# Patient Record
Sex: Female | Born: 1940 | Race: White | Hispanic: No | Smoking: Former smoker
Health system: Southern US, Community
[De-identification: ages and names within clinical notes are randomized; demographics above are authoritative.]

## PROBLEM LIST (undated history)

## (undated) DIAGNOSIS — T8859XA Other complications of anesthesia, initial encounter: Secondary | ICD-10-CM

## (undated) DIAGNOSIS — R1013 Epigastric pain: Secondary | ICD-10-CM

## (undated) DIAGNOSIS — F32A Depression, unspecified: Secondary | ICD-10-CM

## (undated) DIAGNOSIS — I10 Essential (primary) hypertension: Secondary | ICD-10-CM

## (undated) DIAGNOSIS — F419 Anxiety disorder, unspecified: Secondary | ICD-10-CM

## (undated) HISTORY — DX: Epigastric pain: R10.13

## (undated) HISTORY — PX: EYE SURGERY: SHX253

## (undated) HISTORY — PX: OTHER SURGICAL HISTORY: SHX169

## (undated) HISTORY — PX: ABDOMINAL HYSTERECTOMY: SHX81

## (undated) HISTORY — PX: APPENDECTOMY: SHX54

## (undated) HISTORY — PX: INCONTINENCE SURGERY: SHX676

## (undated) HISTORY — PX: BUNIONECTOMY: SHX129

## (undated) HISTORY — PX: TOTAL SHOULDER ARTHROPLASTY: SHX126

## (undated) HISTORY — PX: BACK SURGERY: SHX140

---

## 2013-05-01 DIAGNOSIS — M19019 Primary osteoarthritis, unspecified shoulder: Secondary | ICD-10-CM | POA: Insufficient documentation

## 2014-04-23 DIAGNOSIS — M75120 Complete rotator cuff tear or rupture of unspecified shoulder, not specified as traumatic: Secondary | ICD-10-CM | POA: Insufficient documentation

## 2015-12-09 DIAGNOSIS — Z96612 Presence of left artificial shoulder joint: Secondary | ICD-10-CM | POA: Insufficient documentation

## 2017-10-04 DIAGNOSIS — Z85828 Personal history of other malignant neoplasm of skin: Secondary | ICD-10-CM | POA: Insufficient documentation

## 2018-12-21 DIAGNOSIS — M170 Bilateral primary osteoarthritis of knee: Secondary | ICD-10-CM | POA: Insufficient documentation

## 2019-07-13 ENCOUNTER — Emergency Department
Admission: EM | Admit: 2019-07-13 | Discharge: 2019-07-13 | Disposition: A | Discharge status: Home / Self Care | Payer: Medicare Other | Attending: Emergency Medicine | Admitting: Emergency Medicine

## 2019-07-13 ENCOUNTER — Other Ambulatory Visit: Payer: Self-pay

## 2019-07-13 DIAGNOSIS — I10 Essential (primary) hypertension: Secondary | ICD-10-CM | POA: Diagnosis not present

## 2019-07-13 DIAGNOSIS — K625 Hemorrhage of anus and rectum: Secondary | ICD-10-CM

## 2019-07-13 HISTORY — DX: Essential (primary) hypertension: I10

## 2019-07-13 LAB — CBC
HCT: 46.4 % — ABNORMAL HIGH (ref 36.0–46.0)
Hemoglobin: 14.9 g/dL (ref 12.0–15.0)
MCH: 30.1 pg (ref 26.0–34.0)
MCHC: 32.1 g/dL (ref 30.0–36.0)
MCV: 93.7 fL (ref 80.0–100.0)
Platelets: 205 10*3/uL (ref 150–400)
RBC: 4.95 MIL/uL (ref 3.87–5.11)
RDW: 14 % (ref 11.5–15.5)
WBC: 7.6 10*3/uL (ref 4.0–10.5)
nRBC: 0 % (ref 0.0–0.2)

## 2019-07-13 LAB — BASIC METABOLIC PANEL
Anion gap: 8 (ref 5–15)
BUN: 14 mg/dL (ref 8–23)
CO2: 26 mmol/L (ref 22–32)
Calcium: 9.2 mg/dL (ref 8.9–10.3)
Chloride: 108 mmol/L (ref 98–111)
Creatinine, Ser: 0.58 mg/dL (ref 0.44–1.00)
GFR calc Af Amer: >60 mL/min (ref >60)
GFR calc non Af Amer: >60 mL/min (ref >60)
Glucose, Bld: 104 mg/dL — ABNORMAL HIGH (ref 70–99)
Potassium: 4.1 mmol/L (ref 3.5–5.1)
Sodium: 142 mmol/L (ref 135–145)

## 2019-07-13 NOTE — Discharge Instructions (Signed)
Your workup in the Emergency Department today was reassuring.  We did not find any specific abnormalities.  We recommend you drink plenty of fluids, take your regular medications and/or any new ones prescribed today, and follow up with the doctor(s) listed in these documents as recommended.  Return to the Emergency Department if you develop new or worsening symptoms that concern you.  

## 2019-07-13 NOTE — ED Provider Notes (Signed)
[facility] Emergency Department Provider Note  ____________________________________________   First MD Initiated Contact with Patient 07/13/19 2302     (approximate)  I have reviewed the triage vital signs and the nursing notes.   HISTORY  Chief Complaint Rectal Bleeding    HPI Jessica Leonard is a 78 y.o. female with medical history as listed below which is notable for no prior history of blood clots and no chronic blood thinners.  She presents by private vehicle with her adult daughter at bedside for evaluation of relatively acute onset bright red blood per rectum.  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  She takes a daily fiber as well as an Imodium but sometimes she gets a little bit backed up and due to the Imodium.  This happened yesterday and she could tell that she was becoming constipated so she did not take the Imodium.  When she had a bowel movement it required a significant amount of effort and after she passed the stool she is to "golf ball size" blood clots that "looked like liver".  She has had a little bit of aching pain that is mild in her lower abdomen that has been relatively constant for 1 to 2 days but nothing in particular makes it better or worse.  She had a subsequent bowel movement which had streaks of blood in it, and then her most recent bowel movement had "a few dots of blood".  Because this was a new symptom for her she was concerned and came in for evaluation.  She has a GI doctor in [place] with whom she is well established for years and she reports that she had both an upper and lower endoscopy within the last calendar year which was reassuring except for a few polyps on the colonoscopy.  She denies recent fever/chills, sore throat, cough, shortness of breath, vomiting, and dysuria.  She has had a little bit of nausea but that is not uncommon for her.          Past Medical History:  Diagnosis Date  . Hypertension     There are no active problems to display for this patient.   Past Surgical History:  Procedure Laterality Date  . ABDOMINAL HYSTERECTOMY    . APPENDECTOMY    . back fusion    . cataracts    . INCONTINENCE SURGERY      Prior to Admission medications   Not on File    Allergies Patient has no allergy information on record.  No family history on file.  Social History Social History   Tobacco Use  . Smoking status: Not on file  Substance Use Topics  . Alcohol use: Not on file  . Drug use: Not on file    Review of Systems Constitutional: No fever/chills Eyes: No visual changes. ENT: No sore throat. Cardiovascular: Denies chest pain. Respiratory: Denies shortness of breath. Gastrointestinal: Some mild lower middle aching abdominal pain, some nausea, no vomiting.  Chronic bowel incontinence, recent constipation secondary to Imodium, now stooling habits are regular for her. Genitourinary: Negative for dysuria. Musculoskeletal: Negative for neck pain.  Negative for acute back pain. Integumentary: Negative for rash. Neurological: Negative for headaches, focal weakness or numbness.   ____________________________________________   PHYSICAL EXAM:  VITAL SIGNS: ED Triage Vitals [07/13/19 1829]  Enc Vitals Group     BP (!) 149/68     Pulse Rate 71  Resp 18     Temp 98.2 F (36.8 C)     Temp src      SpO2 100 %     Weight 94.8 kg (209 lb)     Height 1.6 m (5\' 3" )     Head Circumference      Peak Flow      Pain Score 4     Pain Loc      Pain Edu?      Excl. in [place]?     Constitutional: Alert and oriented.  Well-appearing and in no distress. Eyes: Conjunctivae are normal.  Head: Atraumatic. Nose: No congestion/rhinnorhea. Mouth/Throat: Patient is wearing a mask. Neck: No stridor.  No meningeal signs.   Cardiovascular: Normal rate, regular rhythm. Good peripheral circulation. Grossly normal heart  sounds. Respiratory: Normal respiratory effort.  No retractions. Gastrointestinal: Soft and nontender except for a very minor tenderness to palpation in the lower abdomen with no rebound and guarding.  Nondistended.  Rectal exam is notable for no external hemorrhoids, light brown stool in the rectal vault which is heme-negative and quality control passed on the card.  ED chaperone present throughout exam. Musculoskeletal: No lower extremity tenderness nor edema. No gross deformities of extremities. Neurologic:  Normal speech and language. No gross focal neurologic deficits are appreciated.  Skin:  Skin is warm, dry and intact. Psychiatric: Mood and affect are normal. Speech and behavior are normal.  ____________________________________________   LABS (all labs ordered are listed, but only abnormal results are displayed)  Labs Reviewed  CBC - Abnormal; Notable for the following components:      Result Value   HCT 46.4 (*)    All other components within normal limits  BASIC METABOLIC PANEL - Abnormal; Notable for the following components:   Glucose, Bld 104 (*)    All other components within normal limits   ____________________________________________  EKG  No indication for EKG ____________________________________________  RADIOLOGY I, Hinda Kehr, personally viewed and evaluated these images (plain radiographs) as part of my medical decision making, as well as reviewing the written report by the radiologist.  ED MD interpretation: No indication for emergent imaging  Official radiology report(s): No results found.  ____________________________________________   PROCEDURES   Procedure(s) performed (including Critical Care):  Procedures   ____________________________________________   INITIAL IMPRESSION / MDM / ASSESSMENT AND PLAN / ED COURSE  As part of my medical decision making, I reviewed the following data within the [place] History obtained  from family, Labs reviewed , Old chart reviewed and Notes from prior ED visits   Differential diagnosis includes, but is not limited to, temporary bleed secondary to straining during constipation, diverticular bleed, polyps, AV malformation, neoplasm, hemorrhoids, infectious colitis.  The patient is well-appearing and in no distress with stable vital signs.  She is not having any difficulty taking oral intake.  She denies dysuria and denies having any symptoms of urinary tract infection.  She has a very mild bit of reported abdominal pain and a very small amount of tenderness to palpation of the abdomen but she even states that she is not worried about it.  Her rectal exam is reassuring with no active bleeding at this time.  I believe that she likely had a small tear when she was trying to force out her stool when she was briefly constipated but it has resolved over time.  Hemoglobin stable, metabolic panel within normal limits.  I discussed my differential diagnosis and the overall reassuring exam  with the patient and her daughter and they are satisfied.  I explained that I would be willing to order a CT scan of the abdomen and pelvis for her but it would be more to rule out other causes of her pain, not to diagnose the rectal bleeding, and she deferred and does not want any additional testing tonight which I think is appropriate.  She will follow up with her GI doctor but I am also giving her the name and number of a local GI specialist because she has just moved to [place] from [place].  I gave my usual and customary return precautions and she and her daughter understand and agree with the plan.          ____________________________________________  FINAL CLINICAL IMPRESSION(S) / ED DIAGNOSES  Final diagnoses:  Rectal bleeding     MEDICATIONS GIVEN DURING THIS VISIT:  Medications - No data to display   ED Discharge Orders    None      *Please note:  Lanetra Benney was evaluated  in Emergency Department on 07/13/2019 for the symptoms described in the history of present illness. She was evaluated in the context of the global COVID-19 pandemic, which necessitated consideration that the patient might be at risk for infection with the SARS-CoV-2 virus that causes COVID-19. Institutional protocols and algorithms that pertain to the evaluation of patients at risk for COVID-19 are in a state of rapid change based on information released by regulatory bodies including the CDC and federal and state organizations. These policies and algorithms were followed during the patient's care in the ED.  Some ED evaluations and interventions may be delayed as a result of limited staffing during the pandemic.*  Note:  This document was prepared using Dragon voice recognition software and may include unintentional dictation errors.   Hinda Kehr, MD 07/13/19 [phone]

## 2019-07-13 NOTE — ED Notes (Signed)
Patient to patient relations stating that "I has been waiting 5 hours and this is ridiculous". Patient notified that they are cleaning a room for her.

## 2019-07-13 NOTE — ED Notes (Signed)
Pt signed printed d/c paperwork as topaz frozen.  °

## 2019-07-13 NOTE — ED Notes (Signed)
This RN at bedside with EDP Karma Greaser while completing rectal assessment.

## 2019-07-13 NOTE — ED Notes (Signed)
Pt states she passed two large golf ball sized blood clots yesterday morning, and has had 2 BM's since with bright read streaks in stool. Pt describes clots as looking like "little livers". Pt denies hx of hemorrhoids, but reports hx of polyps.

## 2019-07-13 NOTE — ED Triage Notes (Addendum)
Pt comes via POV from home with c/o rectal bleeding. Pt states this started 2 days ago.  Pt states bright red blood in her stool. Pt states some nausea.  Pt denies any abdominal or chest pain. Pt denies SOB.  Pt denies any blood thinners or hx of this.

## 2020-01-14 ENCOUNTER — Emergency Department: Payer: Medicare PPO

## 2020-01-14 ENCOUNTER — Encounter: Payer: Self-pay | Admitting: Emergency Medicine

## 2020-01-14 ENCOUNTER — Other Ambulatory Visit: Payer: Self-pay

## 2020-01-14 ENCOUNTER — Observation Stay
Admission: EM | Admit: 2020-01-14 | Discharge: 2020-01-15 | Disposition: A | Discharge status: Home / Self Care | Payer: Medicare PPO | Attending: Internal Medicine | Admitting: Internal Medicine

## 2020-01-14 ENCOUNTER — Observation Stay: Payer: Medicare PPO

## 2020-01-14 DIAGNOSIS — I10 Essential (primary) hypertension: Secondary | ICD-10-CM | POA: Diagnosis present

## 2020-01-14 DIAGNOSIS — F419 Anxiety disorder, unspecified: Secondary | ICD-10-CM | POA: Insufficient documentation

## 2020-01-14 DIAGNOSIS — R1013 Epigastric pain: Secondary | ICD-10-CM | POA: Diagnosis present

## 2020-01-14 DIAGNOSIS — D3502 Benign neoplasm of left adrenal gland: Secondary | ICD-10-CM | POA: Diagnosis not present

## 2020-01-14 DIAGNOSIS — K2940 Chronic atrophic gastritis without bleeding: Secondary | ICD-10-CM | POA: Diagnosis not present

## 2020-01-14 DIAGNOSIS — K7689 Other specified diseases of liver: Secondary | ICD-10-CM | POA: Insufficient documentation

## 2020-01-14 DIAGNOSIS — E860 Dehydration: Secondary | ICD-10-CM | POA: Diagnosis not present

## 2020-01-14 DIAGNOSIS — R112 Nausea with vomiting, unspecified: Secondary | ICD-10-CM

## 2020-01-14 DIAGNOSIS — K219 Gastro-esophageal reflux disease without esophagitis: Secondary | ICD-10-CM | POA: Diagnosis present

## 2020-01-14 DIAGNOSIS — F32A Depression, unspecified: Secondary | ICD-10-CM | POA: Diagnosis present

## 2020-01-14 DIAGNOSIS — F329 Major depressive disorder, single episode, unspecified: Secondary | ICD-10-CM | POA: Diagnosis not present

## 2020-01-14 DIAGNOSIS — K449 Diaphragmatic hernia without obstruction or gangrene: Secondary | ICD-10-CM | POA: Diagnosis not present

## 2020-01-14 DIAGNOSIS — J9601 Acute respiratory failure with hypoxia: Secondary | ICD-10-CM | POA: Diagnosis present

## 2020-01-14 DIAGNOSIS — E876 Hypokalemia: Secondary | ICD-10-CM | POA: Diagnosis not present

## 2020-01-14 DIAGNOSIS — Z20822 Contact with and (suspected) exposure to covid-19: Secondary | ICD-10-CM | POA: Insufficient documentation

## 2020-01-14 DIAGNOSIS — K317 Polyp of stomach and duodenum: Secondary | ICD-10-CM | POA: Diagnosis not present

## 2020-01-14 DIAGNOSIS — I700 Atherosclerosis of aorta: Secondary | ICD-10-CM | POA: Insufficient documentation

## 2020-01-14 DIAGNOSIS — E785 Hyperlipidemia, unspecified: Secondary | ICD-10-CM | POA: Insufficient documentation

## 2020-01-14 DIAGNOSIS — K8020 Calculus of gallbladder without cholecystitis without obstruction: Secondary | ICD-10-CM | POA: Diagnosis not present

## 2020-01-14 DIAGNOSIS — Z882 Allergy status to sulfonamides status: Secondary | ICD-10-CM | POA: Insufficient documentation

## 2020-01-14 DIAGNOSIS — K769 Liver disease, unspecified: Secondary | ICD-10-CM

## 2020-01-14 HISTORY — DX: Depression, unspecified: F32.A

## 2020-01-14 HISTORY — DX: Anxiety disorder, unspecified: F41.9

## 2020-01-14 HISTORY — DX: Other complications of anesthesia, initial encounter: T88.59XA

## 2020-01-14 LAB — COMPREHENSIVE METABOLIC PANEL
ALT: 18 U/L (ref 0–44)
AST: 25 U/L (ref 15–41)
Albumin: 4.2 g/dL (ref 3.5–5.0)
Alkaline Phosphatase: 95 U/L (ref 38–126)
Anion gap: 13 (ref 5–15)
BUN: 15 mg/dL (ref 8–23)
CO2: 22 mmol/L (ref 22–32)
Calcium: 9.2 mg/dL (ref 8.9–10.3)
Chloride: 100 mmol/L (ref 98–111)
Creatinine, Ser: 0.59 mg/dL (ref 0.44–1.00)
GFR calc Af Amer: >60 mL/min (ref >60)
GFR calc non Af Amer: >60 mL/min (ref >60)
Glucose, Bld: 194 mg/dL — ABNORMAL HIGH (ref 70–99)
Potassium: 3.4 mmol/L — ABNORMAL LOW (ref 3.5–5.1)
Sodium: 135 mmol/L (ref 135–145)
Total Bilirubin: 1.3 mg/dL — ABNORMAL HIGH (ref 0.3–1.2)
Total Protein: 7.7 g/dL (ref 6.5–8.1)

## 2020-01-14 LAB — URINALYSIS, COMPLETE (UACMP) WITH MICROSCOPIC
Bacteria, UA: NONE SEEN
Bilirubin Urine: NEGATIVE
Glucose, UA: 150 mg/dL — AB
Hgb urine dipstick: NEGATIVE
Ketones, ur: 80 mg/dL — AB
Nitrite: NEGATIVE
Protein, ur: 30 mg/dL — AB
Specific Gravity, Urine: 1.035 — ABNORMAL HIGH (ref 1.005–1.030)
pH: 7 (ref 5.0–8.0)

## 2020-01-14 LAB — SARS CORONAVIRUS 2 BY RT PCR (HOSPITAL ORDER, PERFORMED IN ~~LOC~~ HOSPITAL LAB): SARS Coronavirus 2: NEGATIVE

## 2020-01-14 LAB — CBC
HCT: 49.8 % — ABNORMAL HIGH (ref 36.0–46.0)
Hemoglobin: 17.1 g/dL — ABNORMAL HIGH (ref 12.0–15.0)
MCH: 30.6 pg (ref 26.0–34.0)
MCHC: 34.3 g/dL (ref 30.0–36.0)
MCV: 89.2 fL (ref 80.0–100.0)
Platelets: 206 10*3/uL (ref 150–400)
RBC: 5.58 MIL/uL — ABNORMAL HIGH (ref 3.87–5.11)
RDW: 12.6 % (ref 11.5–15.5)
WBC: 7 10*3/uL (ref 4.0–10.5)
nRBC: 0 % (ref 0.0–0.2)

## 2020-01-14 LAB — BRAIN NATRIURETIC PEPTIDE: B Natriuretic Peptide: 37.1 pg/mL (ref 0.0–100.0)

## 2020-01-14 LAB — LIPASE, BLOOD: Lipase: 20 U/L (ref 11–51)

## 2020-01-14 LAB — TROPONIN I (HIGH SENSITIVITY): Troponin I (High Sensitivity): 6 ng/L (ref <18)

## 2020-01-14 MED ORDER — IOHEXOL 300 MG/ML  SOLN
100.0000 mL | Freq: Once | INTRAMUSCULAR | Status: AC | PRN
  Administered 2020-01-14: 100 mL via INTRAVENOUS

## 2020-01-14 MED ORDER — POTASSIUM CHLORIDE CRYS ER 20 MEQ PO TBCR
20.0000 meq | EXTENDED_RELEASE_TABLET | Freq: Once | ORAL | Status: AC
  Administered 2020-01-14: 20 meq via ORAL
  Filled 2020-01-14: qty 1

## 2020-01-14 MED ORDER — DULOXETINE HCL 30 MG PO CPEP
60.0000 mg | ORAL_CAPSULE | Freq: Every day | ORAL | Status: DC
  Administered 2020-01-14 – 2020-01-15 (×2): 60 mg via ORAL
  Filled 2020-01-14: qty 2
  Filled 2020-01-14: qty 1

## 2020-01-14 MED ORDER — METOCLOPRAMIDE HCL 5 MG/ML IJ SOLN
5.0000 mg | Freq: Once | INTRAMUSCULAR | Status: AC
  Administered 2020-01-14: 5 mg via INTRAVENOUS
  Filled 2020-01-14: qty 2

## 2020-01-14 MED ORDER — OCUVITE-LUTEIN PO CAPS
1.0000 | ORAL_CAPSULE | Freq: Two times a day (BID) | ORAL | Status: DC
  Administered 2020-01-14: 1 via ORAL
  Filled 2020-01-14 (×4): qty 1

## 2020-01-14 MED ORDER — SODIUM CHLORIDE 0.9% FLUSH: 3.0000 mL | Freq: Once | INTRAVENOUS | Status: DC

## 2020-01-14 MED ORDER — SODIUM CHLORIDE 0.9 % IV SOLN: Freq: Once | INTRAVENOUS | Status: AC

## 2020-01-14 MED ORDER — MORPHINE SULFATE (PF) 2 MG/ML IV SOLN: 0.5000 mg | INTRAVENOUS | Status: DC | PRN

## 2020-01-14 MED ORDER — IOHEXOL 350 MG/ML SOLN
75.0000 mL | Freq: Once | INTRAVENOUS | Status: AC | PRN
  Administered 2020-01-14: 75 mL via INTRAVENOUS

## 2020-01-14 MED ORDER — ACETAMINOPHEN 325 MG PO TABS: 650.0000 mg | ORAL_TABLET | Freq: Four times a day (QID) | ORAL | Status: DC | PRN

## 2020-01-14 MED ORDER — NIFEDIPINE ER 60 MG PO TB24
60.0000 mg | ORAL_TABLET | Freq: Every day | ORAL | Status: DC
  Administered 2020-01-14 – 2020-01-15 (×2): 60 mg via ORAL
  Filled 2020-01-14 (×2): qty 1

## 2020-01-14 MED ORDER — IPRATROPIUM-ALBUTEROL 0.5-2.5 (3) MG/3ML IN SOLN
3.0000 mL | RESPIRATORY_TRACT | Status: DC
  Administered 2020-01-14 – 2020-01-15 (×4): 3 mL via RESPIRATORY_TRACT
  Filled 2020-01-14 (×4): qty 3

## 2020-01-14 MED ORDER — ATORVASTATIN CALCIUM 20 MG PO TABS
20.0000 mg | ORAL_TABLET | Freq: Every day | ORAL | Status: DC
  Administered 2020-01-14 – 2020-01-15 (×2): 20 mg via ORAL
  Filled 2020-01-14 (×2): qty 1

## 2020-01-14 MED ORDER — HYDROXYZINE HCL 50 MG/ML IM SOLN
25.0000 mg | Freq: Four times a day (QID) | INTRAMUSCULAR | Status: DC | PRN
  Filled 2020-01-14: qty 0.5

## 2020-01-14 MED ORDER — PANTOPRAZOLE SODIUM 40 MG IV SOLR
40.0000 mg | Freq: Once | INTRAVENOUS | Status: AC
  Administered 2020-01-14: 40 mg via INTRAVENOUS
  Filled 2020-01-14: qty 40

## 2020-01-14 MED ORDER — HYDROXYZINE HCL 25 MG PO TABS: 25.0000 mg | ORAL_TABLET | Freq: Three times a day (TID) | ORAL | Status: DC | PRN

## 2020-01-14 MED ORDER — SODIUM CHLORIDE 0.9 % IV BOLUS
500.0000 mL | Freq: Once | INTRAVENOUS | Status: AC
  Administered 2020-01-14: 500 mL via INTRAVENOUS

## 2020-01-14 MED ORDER — ONDANSETRON HCL 4 MG/2ML IJ SOLN: 4.0000 mg | Freq: Three times a day (TID) | INTRAMUSCULAR | Status: DC | PRN

## 2020-01-14 MED ORDER — IMIPRAMINE HCL 25 MG PO TABS
25.0000 mg | ORAL_TABLET | Freq: Every day | ORAL | Status: DC
  Administered 2020-01-14: 25 mg via ORAL
  Filled 2020-01-14 (×3): qty 1

## 2020-01-14 MED ORDER — PANTOPRAZOLE SODIUM 40 MG IV SOLR
40.0000 mg | Freq: Two times a day (BID) | INTRAVENOUS | Status: DC
  Administered 2020-01-14 – 2020-01-15 (×2): 40 mg via INTRAVENOUS
  Filled 2020-01-14 (×2): qty 40

## 2020-01-14 MED ORDER — ALBUTEROL SULFATE (2.5 MG/3ML) 0.083% IN NEBU: 2.5000 mg | INHALATION_SOLUTION | RESPIRATORY_TRACT | Status: DC | PRN

## 2020-01-14 MED ORDER — PSYLLIUM 95 % PO PACK
1.0000 | PACK | Freq: Every day | ORAL | Status: DC
  Administered 2020-01-15: 1 via ORAL
  Filled 2020-01-14 (×2): qty 1

## 2020-01-14 MED ORDER — HYDRALAZINE HCL 20 MG/ML IJ SOLN: 5.0000 mg | INTRAMUSCULAR | Status: DC | PRN

## 2020-01-14 MED ORDER — ONDANSETRON HCL 4 MG/2ML IJ SOLN
4.0000 mg | Freq: Once | INTRAMUSCULAR | Status: AC
  Administered 2020-01-14: 4 mg via INTRAVENOUS
  Filled 2020-01-14: qty 2

## 2020-01-14 MED ORDER — ENOXAPARIN SODIUM 40 MG/0.4ML ~~LOC~~ SOLN
40.0000 mg | SUBCUTANEOUS | Status: DC
  Administered 2020-01-14: 40 mg via SUBCUTANEOUS
  Filled 2020-01-14: qty 0.4

## 2020-01-14 MED ORDER — CALCIUM CARBONATE-VITAMIN D 500-200 MG-UNIT PO TABS
1.0000 | ORAL_TABLET | Freq: Every day | ORAL | Status: DC
  Administered 2020-01-14 – 2020-01-15 (×2): 1 via ORAL
  Filled 2020-01-14 (×3): qty 1

## 2020-01-14 MED ORDER — LOPERAMIDE HCL 2 MG PO CAPS
2.0000 mg | ORAL_CAPSULE | Freq: Every day | ORAL | Status: DC
  Administered 2020-01-14 – 2020-01-15 (×2): 2 mg via ORAL
  Filled 2020-01-14 (×2): qty 1

## 2020-01-14 MED ORDER — SODIUM CHLORIDE 0.9 % IV SOLN: INTRAVENOUS | Status: DC

## 2020-01-14 MED ORDER — MORPHINE SULFATE (PF) 4 MG/ML IV SOLN
4.0000 mg | INTRAVENOUS | Status: DC | PRN
  Administered 2020-01-14: 4 mg via INTRAVENOUS
  Filled 2020-01-14: qty 1

## 2020-01-14 NOTE — ED Notes (Signed)
Pt eating dinner tray °

## 2020-01-14 NOTE — ED Provider Notes (Signed)
[facility] Emergency Department Provider Note    First MD Initiated Contact with Patient 01/14/20 1214     (approximate)  I have reviewed the triage vital signs and the nursing notes.   HISTORY  Chief Complaint Abdominal Pain    HPI Jessica Leonard is a 79 y.o. female with the below listed past medical history presents to the ER for epigastric discomfort nausea and vomiting since last night.  States that she has had multiple episodes of this before but this feels somewhat different and is lasted longer.  States she has a history of hiatal hernia and esophagitis has been on antiacid medication.  Denies any fevers.  No shortness of breath.  Denies any diarrhea.  No sick contacts.    Past Medical History:  Diagnosis Date  . Hypertension    No family history on file. Past Surgical History:  Procedure Laterality Date  . ABDOMINAL HYSTERECTOMY    . APPENDECTOMY    . back fusion    . cataracts    . INCONTINENCE SURGERY     There are no problems to display for this patient.     Prior to Admission medications   Medication Sig Start Date End Date Taking? Authorizing Provider  DULoxetine (CYMBALTA) 60 MG capsule Take 60 mg by mouth daily. 12/26/19  Yes [provider]  imipramine (TOFRANIL) 25 MG tablet Take 25 mg by mouth at bedtime. 12/31/19  Yes [provider]  NIFEdipine (PROCARDIA XL/NIFEDICAL XL) 60 MG 24 hr tablet Take 60 mg by mouth daily. 12/26/19  Yes [provider]  pantoprazole (PROTONIX) 40 MG tablet Take 40 mg by mouth daily. 12/26/19  Yes [provider]  atorvastatin (LIPITOR) 20 MG tablet Take 20 mg by mouth daily. 12/26/19   [provider]    Allergies Sulfa antibiotics    Social History Social History   Tobacco Use  . Smoking status: Never Smoker  . Smokeless tobacco: Never Used  Substance Use Topics  . Alcohol use: Not on file  . Drug use: Not on file    Review of  Systems Patient denies headaches, rhinorrhea, blurry vision, numbness, shortness of breath, chest pain, edema, cough, abdominal pain, nausea, vomiting, diarrhea, dysuria, fevers, rashes or hallucinations unless otherwise stated above in HPI. ____________________________________________   PHYSICAL EXAM:  VITAL SIGNS: Vitals:   01/14/20 1400 01/14/20 1500  BP: (!) 148/97 (!) 142/90  Pulse: (!) 107 (!) 102  Resp: 19 (!) 25  Temp:    SpO2: 93% 90%    Constitutional: Alert and oriented.  Eyes: Conjunctivae are normal.  Head: Atraumatic. Nose: No congestion/rhinnorhea. Mouth/Throat: Mucous membranes are moist.   Neck: No stridor. Painless ROM.  Cardiovascular: Normal rate, regular rhythm. Grossly normal heart sounds.  Good peripheral circulation. Respiratory: Normal respiratory effort.  No retractions. Lungs CTAB. Gastrointestinal: Soft with mild epigastric ttp. No distention. No abdominal bruits. No CVA tenderness. Genitourinary:  Musculoskeletal: No lower extremity tenderness nor edema.  No joint effusions. Neurologic:  Normal speech and language. No gross focal neurologic deficits are appreciated. No facial droop Skin:  Skin is warm, dry and intact. No rash noted. Psychiatric: Mood and affect are normal. Speech and behavior are normal.  ____________________________________________   LABS (all labs ordered are listed, but only abnormal results are displayed)  Results for orders placed or performed during the hospital encounter of 01/14/20 (from the past 24 hour(s))  Lipase, blood     Status: None   Collection Time: 01/14/20 10:25  AM  Result Value Ref Range   Lipase 20 11 - 51 U/L  Comprehensive metabolic panel     Status: Abnormal   Collection Time: 01/14/20 10:25 AM  Result Value Ref Range   Sodium 135 135 - 145 mmol/L   Potassium 3.4 (L) 3.5 - 5.1 mmol/L   Chloride 100 98 - 111 mmol/L   CO2 22 22 - 32 mmol/L   Glucose, Bld 194 (H) 70 - 99 mg/dL   BUN 15 8 - 23 mg/dL    Creatinine, Ser 0.59 0.44 - 1.00 mg/dL   Calcium 9.2 8.9 - 10.3 mg/dL   Total Protein 7.7 6.5 - 8.1 g/dL   Albumin 4.2 3.5 - 5.0 g/dL   AST 25 15 - 41 U/L   ALT 18 0 - 44 U/L   Alkaline Phosphatase 95 38 - 126 U/L   Total Bilirubin 1.3 (H) 0.3 - 1.2 mg/dL   GFR calc non Af Amer >60 >60 mL/min   GFR calc Af Amer >60 >60 mL/min   Anion gap 13 5 - 15  CBC     Status: Abnormal   Collection Time: 01/14/20 10:25 AM  Result Value Ref Range   WBC 7.0 4.0 - 10.5 K/uL   RBC 5.58 (H) 3.87 - 5.11 MIL/uL   Hemoglobin 17.1 (H) 12.0 - 15.0 g/dL   HCT 49.8 (H) 36.0 - 46.0 %   MCV 89.2 80.0 - 100.0 fL   MCH 30.6 26.0 - 34.0 pg   MCHC 34.3 30.0 - 36.0 g/dL   RDW 12.6 11.5 - 15.5 %   Platelets 206 150 - 400 K/uL   nRBC 0.0 0.0 - 0.2 %  Urinalysis, Complete w Microscopic     Status: Abnormal   Collection Time: 01/14/20  1:01 PM  Result Value Ref Range   Color, Urine YELLOW (A) YELLOW   APPearance CLEAR (A) CLEAR   Specific Gravity, Urine 1.035 (H) 1.005 - 1.030   pH 7.0 5.0 - 8.0   Glucose, UA 150 (A) NEGATIVE mg/dL   Hgb urine dipstick NEGATIVE NEGATIVE   Bilirubin Urine NEGATIVE NEGATIVE   Ketones, ur 80 (A) NEGATIVE mg/dL   Protein, ur 30 (A) NEGATIVE mg/dL   Nitrite NEGATIVE NEGATIVE   Leukocytes,Ua TRACE (A) NEGATIVE   RBC / HPF 0-5 0 - 5 RBC/hpf   WBC, UA 0-5 0 - 5 WBC/hpf   Bacteria, UA NONE SEEN NONE SEEN   Squamous Epithelial / LPF 0-5 0 - 5   Mucus PRESENT   Troponin I (High Sensitivity)     Status: None   Collection Time: 01/14/20  2:21 PM  Result Value Ref Range   Troponin I (High Sensitivity) 6 <18 ng/L   ____________________________________________  EKG My review and personal interpretation at Time: 10:31   Indication: N/V  Rate: 100  Rhythm: sinus Axis: right Other: rbbb, no stemi criteria, abnml ekg ____________________________________________  RADIOLOGY  I personally reviewed all radiographic images ordered to evaluate for the above acute complaints and reviewed  radiology reports and findings.  These findings were personally discussed with the patient.  Please see medical record for radiology report.  ____________________________________________   PROCEDURES  Procedure(s) performed:  Procedures    Critical Care performed: no ____________________________________________   INITIAL IMPRESSION / ASSESSMENT AND PLAN / ED COURSE  Pertinent labs & imaging results that were available during my care of the patient were reviewed by me and considered in my medical decision making (see chart for details).   DDX:  gastritis, sbo, enteritis, diverticulitis, pancreatitis, esophagitis, pna  Jessica Leonard is a 79 y.o. who presents to the ED with symptoms as described above.  Patient does appear dehydrated will give IV fluids.  Will give IV antiemetics and pain medication.  Clinical Course as of Jan 14 1535  Mon Jan 14, 2020  1452 Patient reassessed.  She states that she feels improved and is requesting discharge home.  I did strongly recommend hospitalization for observation given her nausea and vomiting.  Concern is that she might have norovirus given her dehydration but patient states that she has frequent episodes with this in the past and once it passes she feels much better.  Will trial ginger ale and reassess.   [PR]  1530 Patient is also having some moderate epigastric pain.  She not requiring O2 at this point.  I discussed the case in consultation with Dr. Marius Ditch of GI who does recommend hospitalization for continued IV fluids keep n.p.o. for possible EGD.  Patient agreeable to plan.   [PR]    Clinical Course User Index [PR] Merlyn Lot, MD    The patient was evaluated in Emergency Department today for the symptoms described in the history of present illness. He/she was evaluated in the context of the global COVID-19 pandemic, which necessitated consideration that the patient might be at risk for infection with the SARS-CoV-2 virus that  causes COVID-19. Institutional protocols and algorithms that pertain to the evaluation of patients at risk for COVID-19 are in a state of rapid change based on information released by regulatory bodies including the CDC and federal and state organizations. These policies and algorithms were followed during the patient's care in the ED.  As part of my medical decision making, I reviewed the following data within the [facility] notes reviewed and incorporated, Labs reviewed, notes from prior ED visits and [place] Controlled Substance Database   ____________________________________________   FINAL CLINICAL IMPRESSION(S) / ED DIAGNOSES  Final diagnoses:  Epigastric pain  Nausea and vomiting, intractability of vomiting not specified, unspecified vomiting type  Dehydration      NEW MEDICATIONS STARTED DURING THIS VISIT:  New Prescriptions   No medications on file     Note:  This document was prepared using Dragon voice recognition software and may include unintentional dictation errors.    Merlyn Lot, MD 01/14/20 1537

## 2020-01-14 NOTE — ED Notes (Signed)
Pt gown, wallet and cellphone sent with pt to floor in belongings bag

## 2020-01-14 NOTE — ED Notes (Signed)
Pt SpO2 high 80's. Placed on 2L [place]

## 2020-01-14 NOTE — ED Notes (Signed)
Attempted to call report to floor, was told RN would call back 

## 2020-01-14 NOTE — ED Notes (Signed)
Pt assisted to restroom with this RN, family at bedside. Call bell within reach. Stretcher locked in lowest position.  Pt 93% on 3L [place]. States does not wear [place] at home

## 2020-01-14 NOTE — ED Notes (Signed)
Pt reports to ED for abdominal pain that started at 2030 last night. States started having nausea and vomiting last night that was "mostly mucus". Denies fevers.  Reports last BM last night.  Pt alert and oriented.

## 2020-01-14 NOTE — Consult Note (Signed)
[name], MD [address]  [place]  [place], [place] [address]  Main: [phone]  Fax: [phone] Pager: [phone]   Consultation  Referring Provider:     No ref. provider found Primary Care Physician:  System, Pcp Not In Primary Gastroenterologist: [facility]         Reason for Consultation:     Epigastric pain, nausea and vomiting  Date of Admission:  01/14/2020 Date of Consultation:  01/14/2020         HPI:   Jessica Leonard is a 79 y.o. female with history of hypertension, who is otherwise healthy, history of hiatal hernia presented to ER today secondary to episode of upper abdominal pain associated with nausea and vomiting.  She acknowledged having several episodes before she presented to ER.  Patient has known history of hiatal hernia for which she is taking Protonix 40 mg daily.  Patient was hemodynamically stable, lipase normal, isolated mildly elevated T bili 1.3, hemoglobin 17.1, negative troponin, positive ketones and protein in urine.  Chest x-ray revealed large hiatal hernia, underwent CT abdomen and pelvis with contrast revealed large hiatal hernia, ascites, distal esophagus fluid-filled and dilated, enlarged pulmonary trunk indicative of pulmonary arterial hypertension.  SARS COVID-19 negative.  She was mildly hypoxic at 86% on room air.  Therefore undergoing CT PE protocol.  Patient is admitted for further evaluation  During my encounter with patient, she denies abdominal pain, nausea or vomiting.  She was lying comfortably in bed.  Her daughter was bedside.   NSAIDs: None  Antiplts/Anticoagulants/Anti thrombotics: None  GI Procedures: Upper endoscopy in 08/2018, report not available  Past Medical History:  Diagnosis Date  . Hypertension     Past Surgical History:  Procedure Laterality Date  . ABDOMINAL HYSTERECTOMY    . APPENDECTOMY    . back fusion    . cataracts    . INCONTINENCE SURGERY      Prior to Admission  medications   Medication Sig Start Date End Date Taking? Authorizing Provider  acetaminophen (TYLENOL) 500 MG tablet Take 500 mg by mouth 2 (two) times daily.   Yes [provider]  atorvastatin (LIPITOR) 20 MG tablet Take 20 mg by mouth daily. 12/26/19  Yes [provider]  Calcium Carb-Cholecalciferol (CALCIUM 600+D) 600-800 MG-UNIT TABS Take 1 tablet by mouth daily.   Yes [provider]  DULoxetine (CYMBALTA) 60 MG capsule Take 60 mg by mouth daily. 12/26/19  Yes [provider]  imipramine (TOFRANIL) 25 MG tablet Take 25 mg by mouth at bedtime. 12/31/19  Yes [provider]  loperamide (IMODIUM A-D) 2 MG tablet Take 1 mg by mouth daily.   Yes [provider]  Multiple Vitamins-Minerals (PRESERVISION AREDS 2) CAPS Take 1 capsule by mouth 2 (two) times daily.   Yes [provider]  NIFEdipine (PROCARDIA XL/NIFEDICAL XL) 60 MG 24 hr tablet Take 60 mg by mouth daily. 12/26/19  Yes [provider]  pantoprazole (PROTONIX) 40 MG tablet Take 40 mg by mouth daily. 12/26/19  Yes [provider]  psyllium (METAMUCIL) 58.6 % packet Take 1 packet by mouth daily.   Yes [provider]   Current Facility-Administered Medications:  .  albuterol (PROVENTIL) (2.5 MG/3ML) 0.083% nebulizer solution 2.5 mg, 2.5 mg, Nebulization, Q4H PRN, Ivor Costa, MD .  iohexol (OMNIPAQUE) 350 MG/ML injection 75 mL, 75 mL, Intravenous, Once PRN, Joan Mayans, Sarah L., MD .  ipratropium-albuterol (DUONEB) 0.5-2.5 (3) MG/3ML nebulizer solution 3 mL, 3  mL, Nebulization, Q4H, Ivor Costa, MD .  morphine 2 MG/ML injection 0.5 mg, 0.5 mg, Intravenous, Q3H PRN, Ivor Costa, MD .  pantoprazole (PROTONIX) injection 40 mg, 40 mg, Intravenous, Q12H, Vanga, Tally Due, MD .  sodium chloride flush (NS) 0.9 % injection 3 mL, 3 mL, Intravenous, Once, Merlyn Lot, MD  Current Outpatient Medications:  .  acetaminophen (TYLENOL) 500 MG tablet, Take 500 mg by  mouth 2 (two) times daily., Disp: , Rfl:  .  atorvastatin (LIPITOR) 20 MG tablet, Take 20 mg by mouth daily., Disp: , Rfl:  .  Calcium Carb-Cholecalciferol (CALCIUM 600+D) 600-800 MG-UNIT TABS, Take 1 tablet by mouth daily., Disp: , Rfl:  .  DULoxetine (CYMBALTA) 60 MG capsule, Take 60 mg by mouth daily., Disp: , Rfl:  .  imipramine (TOFRANIL) 25 MG tablet, Take 25 mg by mouth at bedtime., Disp: , Rfl:  .  loperamide (IMODIUM A-D) 2 MG tablet, Take 1 mg by mouth daily., Disp: , Rfl:  .  Multiple Vitamins-Minerals (PRESERVISION AREDS 2) CAPS, Take 1 capsule by mouth 2 (two) times daily., Disp: , Rfl:  .  NIFEdipine (PROCARDIA XL/NIFEDICAL XL) 60 MG 24 hr tablet, Take 60 mg by mouth daily., Disp: , Rfl:  .  pantoprazole (PROTONIX) 40 MG tablet, Take 40 mg by mouth daily., Disp: , Rfl:  .  psyllium (METAMUCIL) 58.6 % packet, Take 1 packet by mouth daily., Disp: , Rfl:    No family history on file.   Social History   Tobacco Use  . Smoking status: Never Smoker  . Smokeless tobacco: Never Used  Substance Use Topics  . Alcohol use: Not on file  . Drug use: Not on file    Allergies as of 01/14/2020 - Review Complete 01/14/2020  Allergen Reaction Noted  . Sulfa antibiotics  01/14/2020    Review of Systems:    All systems reviewed and negative except where noted in HPI.   Physical Exam:  Vital signs in last 24 hours: Temp:  [97.7 F (36.5 C)] 97.7 F (36.5 C) (06/07 1019) Pulse Rate:  [68-107] 91 (06/07 1600) Resp:  [15-25] 18 (06/07 1600) BP: (142-171)/(77-107) 156/84 (06/07 1600) SpO2:  [90 %-95 %] 94 % (06/07 1600) Weight:  [94.8 kg] 94.8 kg (06/07 1016)   General:   Pleasant, cooperative in NAD Head:  Normocephalic and atraumatic. Eyes:   No icterus.   Conjunctiva pink. PERRLA. Ears:  Normal auditory acuity. Neck:  Supple; no masses or thyroidomegaly Lungs: Respirations even and unlabored. Lungs clear to auscultation bilaterally.   No wheezes, crackles, or rhonchi.  Heart:   Regular rate and rhythm;  Without murmur, clicks, rubs or gallops Abdomen:  Soft, nondistended, nontender. Normal bowel sounds. No appreciable masses or hepatomegaly.  No rebound or guarding.  Rectal:  Not performed. Msk:  Symmetrical without gross deformities.  Extremities:  Without edema, cyanosis or clubbing. Neurologic:  Alert and oriented x3;  grossly normal neurologically. Skin:  Intact without significant lesions or rashes. Psych:  Alert and cooperative. Normal affect.  LAB RESULTS: CBC Latest Ref Rng & Units 01/14/2020 07/13/2019  WBC 4.0 - 10.5 K/uL 7.0 7.6  Hemoglobin 12.0 - 15.0 g/dL 17.1(H) 14.9  Hematocrit 36.0 - 46.0 % 49.8(H) 46.4(H)  Platelets 150 - 400 K/uL 206 205    BMET BMP Latest Ref Rng & Units 01/14/2020 07/13/2019  Glucose 70 - 99 mg/dL 194(H) 104(H)  BUN 8 - 23 mg/dL 15 14  Creatinine 0.44 - 1.00 mg/dL 0.59 0.58  Sodium  135 - 145 mmol/L 135 142  Potassium 3.5 - 5.1 mmol/L 3.4(L) 4.1  Chloride 98 - 111 mmol/L 100 108  CO2 22 - 32 mmol/L 22 26  Calcium 8.9 - 10.3 mg/dL 9.2 9.2    LFT Hepatic Function Latest Ref Rng & Units 01/14/2020  Total Protein 6.5 - 8.1 g/dL 7.7  Albumin 3.5 - 5.0 g/dL 4.2  AST 15 - 41 U/L 25  ALT 0 - 44 U/L 18  Alk Phosphatase 38 - 126 U/L 95  Total Bilirubin 0.3 - 1.2 mg/dL 1.3(H)     STUDIES: CT ABDOMEN PELVIS W CONTRAST  Result Date: 01/14/2020 CLINICAL DATA:  Abdominal pain, nausea and vomiting. EXAM: CT ABDOMEN AND PELVIS WITH CONTRAST TECHNIQUE: Multidetector CT imaging of the abdomen and pelvis was performed using the standard protocol following bolus administration of intravenous contrast. CONTRAST:  176m OMNIPAQUE IOHEXOL 300 MG/ML  SOLN COMPARISON:  None. FINDINGS: Lower chest: Mild volume loss in both lung bases. Pulmonic trunk is enlarged. Atherosclerotic calcification of the aorta and coronary arteries. Heart size is at the upper limits of normal. No pericardial or pleural effusion. Large hiatal hernia contains the  majority of the stomach and some ascites. Visualized distal esophagus is fluid-filled and dilated. Hepatobiliary: Low-attenuation lesion in the liver measures 3.7 cm and is likely a cyst. Liver and gallbladder are otherwise unremarkable. No biliary ductal dilatation. Pancreas: Negative. Spleen: Negative. Adrenals/Urinary Tract: Right adrenal gland is unremarkable. A 2.3 cm left adrenal nodule has a washout of 47%. Subcentimeter low-attenuation lesion in the left kidney is too small to characterize but statistically, a cyst is likely. Mild scarring in the left kidney. There may be a punctate stone in the left kidney. Ureters are decompressed. Bladder is grossly unremarkable. Stomach/Bowel: Large hiatal hernia. Stomach, small bowel and colon are unremarkable. Appendix is not readily visualized. Vascular/Lymphatic: Atherosclerotic calcification of the aorta without aneurysm. No pathologically enlarged lymph nodes. Reproductive: Hysterectomy.  No adnexal mass. Other: Small left periumbilical and right inguinal hernias contain fat. Mesenteries and peritoneum are otherwise unremarkable. Musculoskeletal: Postoperative changes in the spine. Degenerative changes in the thoracolumbar spine. No worrisome lytic or sclerotic lesions. T11 and T12 compression fractures are age indeterminate but likely old. IMPRESSION: 1. No acute findings to explain the patient's symptoms. 2. Large hiatal hernia with a small amount of ascites in the adjacent fat. Visualized distal esophagus is fluid-filled and dilated. 3. Left adrenal adenoma. 4. Possible punctate left renal stone. 5. Aortic atherosclerosis (ICD10-I70.0). Coronary artery calcification. 6. Enlarged pulmonic trunk, indicative of pulmonary arterial hypertension. Electronically Signed   By: MLorin PicketM.D.   On: 01/14/2020 13:13   DG Chest Portable 1 View  Result Date: 01/14/2020 CLINICAL DATA:  Epigastric pain. EXAM: PORTABLE CHEST 1 VIEW COMPARISON:  None. FINDINGS: Large  hiatal hernia. Cardiac silhouette is normal in size. No mediastinal or hilar masses or evidence of adenopathy. Mild linear opacities noted the lung bases consistent with atelectasis. Lungs otherwise clear. No pleural effusion or pneumothorax. Bilateral shoulder prostheses appear well aligned. No acute skeletal abnormality. IMPRESSION: No acute cardiopulmonary disease.  Large hiatal hernia Electronically Signed   By: DLajean ManesM.D.   On: 01/14/2020 14:29      Impression / Plan:   MMellissa Conleyis a 79y.o. female with history of hypertension, large hiatal hernia is seen in consultation for epigastric pain, nausea and vomiting.  CT abdomen and pelvis did not reveal any acute intra-abdominal pathology other than large hiatal hernia and fluid-filled esophagus.  There is no evidence of pancreatitis or hepatobiliary etiology  Epigastric pain with nausea and vomiting: Currently asymptomatic Known history of large hiatal hernia.  Differentials include Lysbeth Galas ulcers or erosive esophagitis or peptic ulcer disease Recommend pantoprazole 40 mg IV twice daily Recommend upper endoscopy after work-up of hypoxia N.p.o. past midnight  Thank you for involving me in the care of this patient.  GI will follow along with you    LOS: 0 days   Sherri Sear, MD  01/14/2020, 5:14 PM   Note: This dictation was prepared with Dragon dictation along with smaller phrase technology. Any transcriptional errors that result from this process are unintentional.

## 2020-01-14 NOTE — ED Provider Notes (Addendum)
Patient seen and evaluated in person by GI, Dr. Marius Ditch. Dr. Marius Ditch able to confirm that patient has had a recent scope in January of this year with [facility], and has had a known hiatal hernia.  Therefore doubt that the CT findings represent new hernia ischemia or torsion related to the hiatal hernia. She is feeling improved and has tolerated PO.  Per GI, she does not require admission from their perspective, would be stable for discharge with close outpatient follow-up and increasing her PPI from QD to BID.    Unfortunately, patient continues to be hypoxic, down to 86% on RA when taken off oxygen. This is new for her, though she does state that once after a back surgery she was told she was hypoxic and had to be on home oxygen for a brief period.  On CT imaging there are some findings that are suggestive of possible pulmonary arterial hypertension. Patient denies any hx of this. Will place back on supplemental oxygen and plan to admit for further work-up of her new hypoxia, as well as further GI symptom control.  Discussed results and plan of care with the patient and family at bedside, they are in agreement.  Discussed with hospitalist.  Hospitalist recommends CT PE for further evaluation and rule out of PE, agree with this.  Discussed with CT who confirmed with radiology that repeat scan with contrast here is okay for rule out of PE (as she received contrast for her abdominal imaging), they will give a reduced dose, her creatinine is within normal limits.  She is on NS infusion.     Lilia Pro., MD 01/14/20 [phone]

## 2020-01-14 NOTE — ED Notes (Signed)
Pt given gingerale as requested.

## 2020-01-14 NOTE — ED Notes (Signed)
Pt assisted to toilet with this RN

## 2020-01-14 NOTE — H&P (Signed)
History and Physical    Kailen Name QBH:[id number] DOB: January 15, 1941 DOA: 01/14/2020  Referring MD/NP/PA:   PCP: System, Pcp Not In   Patient coming from:  The patient is coming from home.  At baseline, pt is independent for most of ADL.        Chief Complaint: Abdominal pain and cough  HPI: Margie Garay is a 79 y.o. female with medical history significant of hypertension, hyperlipidemia, GERD, depression, hiatal hernia, who presents with nausea, vomiting, abdominal pain and cough.  Patient states that she has been having nausea vomiting, abdominal pain for several days, which has progressively worsened today.  The abdominal pain is located in the epigastric area, constant, moderate to severe, sharp, nonradiating.  She has had at least 5 times of nonbilious nonbloody vomiting.  No diarrhea.  She also reports nonproductive cough, particularly in the morning recently.  Patient was found to have oxygen desaturated to upper 80s on room air, which improved to 95% on 3 L nasal cannula oxygen.  Patient does not use oxygen at home. Denies chest pain, shortness of breath.  No fever or chills.  No symptoms of UTI or unilateral weakness. Pt was found to have   ED Course: pt was found to have WBC 7.0, negative COVID-19 PCR, negative troponin, negative urinalysis, potassium was 3.4, renal function okay, temperature normal, blood pressure 156/84, tachycardia, tachypnea, chest x-ray showed large hiatal hernia. CT abdomen and pelvis with contrast revealed large hiatal hernia, ascites, distal esophagus fluid-filled and dilated, enlarged pulmonary trunk indicative of pulmonary arterial hypertension. Pt is placed on progressive bed for observation.  Dr. Marius Ditch of GI is consulted.  CTA of chest: 1. No evidence of pulmonary embolism. 2. Very large gastric hernia. 3. Cholelithiasis. 4. 3.7 cm x 2.9 cm well-defined area of low attenuation within the anteromedial aspect of the right lobe of the liver. This may  represent a cyst or hemangioma. Correlation with hepatic ultrasound is recommended. 5. 2.6 cm x 2.1 cm low-attenuation left adrenal mass which may represent an adrenal adenoma. 6. Aortic atherosclerosis.  Review of Systems:   General: no fevers, chills, no body weight gain, has fatigue HEENT: no blurry vision, hearing changes or sore throat Respiratory: no dyspnea, has coughing, no wheezing CV: no chest pain, no palpitations GI: has nausea, vomiting, abdominal pain, no diarrhea, constipation GU: no dysuria, burning on urination, increased urinary frequency, hematuria  Ext: no leg edema Neuro: no unilateral weakness, numbness, or tingling, no vision change or hearing loss Skin: no rash, no skin tear. MSK: No muscle spasm, no deformity, no limitation of range of movement in spin Heme: No easy bruising.  Travel history: No recent long distant travel.  Allergy:  Allergies  Allergen Reactions  . Sulfa Antibiotics     Past Medical History:  Diagnosis Date  . Hypertension     Past Surgical History:  Procedure Laterality Date  . ABDOMINAL HYSTERECTOMY    . APPENDECTOMY    . back fusion    . cataracts    . INCONTINENCE SURGERY      Social History:  reports that she has never smoked. She has never used smokeless tobacco. She reports previous alcohol use. She reports that she does not use drugs.  Family History:  Family History  Problem Relation Age of Onset  . Diabetes Mellitus II Father      Prior to Admission medications   Medication Sig Start Date End Date Taking? Authorizing Provider  acetaminophen (TYLENOL) 500 MG tablet Take 500  mg by mouth 2 (two) times daily.   Yes [provider]  atorvastatin (LIPITOR) 20 MG tablet Take 20 mg by mouth daily. 12/26/19  Yes [provider]  Calcium Carb-Cholecalciferol (CALCIUM 600+D) 600-800 MG-UNIT TABS Take 1 tablet by mouth daily.   Yes [provider]  DULoxetine (CYMBALTA) 60 MG capsule Take 60 mg by  mouth daily. 12/26/19  Yes [provider]  imipramine (TOFRANIL) 25 MG tablet Take 25 mg by mouth at bedtime. 12/31/19  Yes [provider]  loperamide (IMODIUM A-D) 2 MG tablet Take 1 mg by mouth daily.   Yes [provider]  Multiple Vitamins-Minerals (PRESERVISION AREDS 2) CAPS Take 1 capsule by mouth 2 (two) times daily.   Yes [provider]  NIFEdipine (PROCARDIA XL/NIFEDICAL XL) 60 MG 24 hr tablet Take 60 mg by mouth daily. 12/26/19  Yes [provider]  pantoprazole (PROTONIX) 40 MG tablet Take 40 mg by mouth daily. 12/26/19  Yes [provider]  psyllium (METAMUCIL) 58.6 % packet Take 1 packet by mouth daily.   Yes [provider]    Physical Exam: Vitals:   01/14/20 1600 01/14/20 1615 01/14/20 1645 01/14/20 1715  BP: (!) 156/84 (!) 138/93 125/74 123/72  Pulse: 91 100 94 93  Resp: 18 18 18 11   Temp:      TempSrc:      SpO2: 94% 94% 95% 96%  Weight:      Height:       General: Not in acute distress HEENT:       Eyes: PERRL, EOMI, no scleral icterus.       ENT: No discharge from the ears and nose, no pharynx injection, no tonsillar enlargement.        Neck: No JVD, no bruit, no mass felt. Heme: No neck lymph node enlargement. Cardiac: S1/S2, RRR, No murmurs, No gallops or rubs. Respiratory: No rales, wheezing, rhonchi or rubs. GI: Soft, nondistended, has tenderness in epigastric area, no rebound pain, no organomegaly, BS present. GU: No hematuria Ext: No pitting leg edema bilaterally. 2+DP/PT pulse bilaterally. Musculoskeletal: No joint deformities, No joint redness or warmth, no limitation of ROM in spin. Skin: No rashes.  Neuro: Alert, oriented X3, cranial nerves II-XII grossly intact, moves all extremities normally.  Psych: Patient is not psychotic, no suicidal or hemocidal ideation.  Labs on Admission: I have personally reviewed following labs and imaging studies  CBC: Recent Labs  Lab 01/14/20 1025  WBC  7.0  HGB 17.1*  HCT 49.8*  MCV 89.2  PLT 366   Basic Metabolic Panel: Recent Labs  Lab 01/14/20 1025  NA 135  K 3.4*  CL 100  CO2 22  GLUCOSE 194*  BUN 15  CREATININE 0.59  CALCIUM 9.2   GFR: Estimated Creatinine Clearance: 63.5 mL/min (by C-G formula based on SCr of 0.59 mg/dL). Liver Function Tests: Recent Labs  Lab 01/14/20 1025  AST 25  ALT 18  ALKPHOS 95  BILITOT 1.3*  PROT 7.7  ALBUMIN 4.2   Recent Labs  Lab 01/14/20 1025  LIPASE 20   No results for input(s): AMMONIA in the last 168 hours. Coagulation Profile: No results for input(s): INR, PROTIME in the last 168 hours. Cardiac Enzymes: No results for input(s): CKTOTAL, CKMB, CKMBINDEX, TROPONINI in the last 168 hours. BNP (last 3 results) No results for input(s): PROBNP in the last 8760 hours. HbA1C: No results for input(s): HGBA1C in the last 72 hours. CBG: No results for input(s): GLUCAP in  the last 168 hours. Lipid Profile: No results for input(s): CHOL, HDL, LDLCALC, TRIG, CHOLHDL, LDLDIRECT in the last 72 hours. Thyroid Function Tests: No results for input(s): TSH, T4TOTAL, FREET4, T3FREE, THYROIDAB in the last 72 hours. Anemia Panel: No results for input(s): VITAMINB12, FOLATE, FERRITIN, TIBC, IRON, RETICCTPCT in the last 72 hours. Urine analysis:    Component Value Date/Time   COLORURINE YELLOW (A) 01/14/2020 1301   APPEARANCEUR CLEAR (A) 01/14/2020 1301   LABSPEC 1.035 (H) 01/14/2020 1301   PHURINE 7.0 01/14/2020 1301   GLUCOSEU 150 (A) 01/14/2020 1301   HGBUR NEGATIVE 01/14/2020 1301   BILIRUBINUR NEGATIVE 01/14/2020 1301   KETONESUR 80 (A) 01/14/2020 1301   PROTEINUR 30 (A) 01/14/2020 1301   NITRITE NEGATIVE 01/14/2020 1301   LEUKOCYTESUR TRACE (A) 01/14/2020 1301   Sepsis Labs: @LABRCNTIP (procalcitonin:4,lacticidven:4) ) Recent Results (from the past 240 hour(s))  SARS Coronavirus 2 by RT PCR (hospital order, performed in [place] hospital lab) Nasopharyngeal Nasopharyngeal  Swab     Status: None   Collection Time: 01/14/20  3:55 PM   Specimen: Nasopharyngeal Swab  Result Value Ref Range Status   SARS Coronavirus 2 NEGATIVE NEGATIVE Final    Comment: (NOTE) SARS-CoV-2 target nucleic acids are NOT DETECTED. The SARS-CoV-2 RNA is generally detectable in upper and lower respiratory specimens during the acute phase of infection. The lowest concentration of SARS-CoV-2 viral copies this assay can detect is 250 copies / mL. A negative result does not preclude SARS-CoV-2 infection and should not be used as the sole basis for treatment or other patient management decisions.  A negative result may occur with improper specimen collection / handling, submission of specimen other than nasopharyngeal swab, presence of viral mutation(s) within the areas targeted by this assay, and inadequate number of viral copies (<250 copies / mL). A negative result must be combined with clinical observations, patient history, and epidemiological information. Fact Sheet for Patients:   StrictlyIdeas.no Fact Sheet for Healthcare Providers: BankingDealers.co.za This test is not yet approved or cleared  by the [place] FDA and has been authorized for detection and/or diagnosis of SARS-CoV-2 by FDA under an Emergency Use Authorization (EUA).  This EUA will remain in effect (meaning this test can be used) for the duration of the COVID-19 declaration under Section 564(b)(1) of the Act, 21 U.S.C. section 360bbb-3(b)(1), unless the authorization is terminated or revoked sooner. Performed at [facility], [address] [place], [place] [address]      Radiological Exams on Admission: CT ABDOMEN PELVIS W CONTRAST  Result Date: 01/14/2020 CLINICAL DATA:  Abdominal pain, nausea and vomiting. EXAM: CT ABDOMEN AND PELVIS WITH CONTRAST TECHNIQUE: Multidetector CT imaging of the abdomen and pelvis was performed using the standard  protocol following bolus administration of intravenous contrast. CONTRAST:  182mL OMNIPAQUE IOHEXOL 300 MG/ML  SOLN COMPARISON:  None. FINDINGS: Lower chest: Mild volume loss in both lung bases. Pulmonic trunk is enlarged. Atherosclerotic calcification of the aorta and coronary arteries. Heart size is at the upper limits of normal. No pericardial or pleural effusion. Large hiatal hernia contains the majority of the stomach and some ascites. Visualized distal esophagus is fluid-filled and dilated. Hepatobiliary: Low-attenuation lesion in the liver measures 3.7 cm and is likely a cyst. Liver and gallbladder are otherwise unremarkable. No biliary ductal dilatation. Pancreas: Negative. Spleen: Negative. Adrenals/Urinary Tract: Right adrenal gland is unremarkable. A 2.3 cm left adrenal nodule has a washout of 47%. Subcentimeter low-attenuation lesion in the left kidney is too small to characterize  but statistically, a cyst is likely. Mild scarring in the left kidney. There may be a punctate stone in the left kidney. Ureters are decompressed. Bladder is grossly unremarkable. Stomach/Bowel: Large hiatal hernia. Stomach, small bowel and colon are unremarkable. Appendix is not readily visualized. Vascular/Lymphatic: Atherosclerotic calcification of the aorta without aneurysm. No pathologically enlarged lymph nodes. Reproductive: Hysterectomy.  No adnexal mass. Other: Small left periumbilical and right inguinal hernias contain fat. Mesenteries and peritoneum are otherwise unremarkable. Musculoskeletal: Postoperative changes in the spine. Degenerative changes in the thoracolumbar spine. No worrisome lytic or sclerotic lesions. T11 and T12 compression fractures are age indeterminate but likely old. IMPRESSION: 1. No acute findings to explain the patient's symptoms. 2. Large hiatal hernia with a small amount of ascites in the adjacent fat. Visualized distal esophagus is fluid-filled and dilated. 3. Left adrenal adenoma. 4.  Possible punctate left renal stone. 5. Aortic atherosclerosis (ICD10-I70.0). Coronary artery calcification. 6. Enlarged pulmonic trunk, indicative of pulmonary arterial hypertension. Electronically Signed   By: Lorin Picket M.D.   On: 01/14/2020 13:13   DG Chest Portable 1 View  Result Date: 01/14/2020 CLINICAL DATA:  Epigastric pain. EXAM: PORTABLE CHEST 1 VIEW COMPARISON:  None. FINDINGS: Large hiatal hernia. Cardiac silhouette is normal in size. No mediastinal or hilar masses or evidence of adenopathy. Mild linear opacities noted the lung bases consistent with atelectasis. Lungs otherwise clear. No pleural effusion or pneumothorax. Bilateral shoulder prostheses appear well aligned. No acute skeletal abnormality. IMPRESSION: No acute cardiopulmonary disease.  Large hiatal hernia Electronically Signed   By: Lajean Manes M.D.   On: 01/14/2020 14:29     EKG: Independently reviewed.  Sinus rhythm, QTC 505, RAD, right bundle blockage  Assessment/Plan Principal Problem:   Acute respiratory failure with hypoxia (HCC) Active Problems:   HLD (hyperlipidemia)   Hypertension   GERD (gastroesophageal reflux disease)   Depression   Hiatal hernia   Hypokalemia   Acute respiratory failure with hypoxia ([place]): Etiology is not clear.  CT angiogram is negative for PE.  BNP 37, no leg edema, unlikely to have CHF.  Patient does not have fever or leukocytosis.  Less likely to have pneumonia.  Possible explanation is due to large hiatal hernia.  -Placed progressive benefit observation -DuoNeb nebulizer, as needed albuterol, as needed Mucinex -Nasal cannula oxygen to maintain oxygen saturation above 93%  HLD (hyperlipidemia) -Lipitor  HTN:  -Continue home medications: Nifedipine -hydralazine prn  GERD (gastroesophageal reflux disease) -protonix  Depression -Cymbalta  Hypokalemia: K= 3.4 on admission. - Repleted - Check Mg level - Give 1 g of magnesium sulfate  Hiatal hernia: Patient's  abdominal pain is most likely due to hiatal hernia. -Increased Protonix to 40 mg twice daily -Dr. Marius Ditch is planning to do EGD morning  Liver lesion: CTA incidentally showed a 3.7 cm x 2.9 cm well-defined area of low attenuation within the anteromedial aspect of the right lobe of the liver. This may represent a cyst or hemangioma.  -UR-RUQ   DVT ppx: SQ Lovenox Code Status: Full code Family Communication:  Yes, patient's daughter   at bed side Disposition Plan:  Anticipate discharge back to previous environment Consults called: Dr. Marius Ditch of GI Admission status:  progressive unit for obs       Status is: Observation  The patient remains OBS appropriate and will d/c before 2 midnights.  Dispo: The patient is from: Home              Anticipated d/c is to: Home  Anticipated d/c date is: 1 day              Patient currently is not medically stable to d/c.           Date of Service 01/14/2020    Ivor Costa [facility]   If 7PM-7AM, please contact night-coverage [email or web address] 01/14/2020, 6:08 PM

## 2020-01-14 NOTE — ED Notes (Signed)
Pharmacy contacted to verify medications.

## 2020-01-14 NOTE — ED Notes (Signed)
Pt in CT.

## 2020-01-14 NOTE — ED Triage Notes (Signed)
C/O abdominal pain and vomiting since last night.  250 cc NS given by EMS PTA.

## 2020-01-15 ENCOUNTER — Encounter: Payer: Self-pay | Admitting: Internal Medicine

## 2020-01-15 ENCOUNTER — Observation Stay: Payer: Medicare PPO | Admitting: Certified Registered"

## 2020-01-15 ENCOUNTER — Encounter
Admission: EM | Disposition: A | Discharge status: Home / Self Care | Payer: Self-pay | Source: Home / Self Care | Attending: Student in an Organized Health Care Education/Training Program

## 2020-01-15 DIAGNOSIS — R1013 Epigastric pain: Secondary | ICD-10-CM

## 2020-01-15 DIAGNOSIS — R112 Nausea with vomiting, unspecified: Secondary | ICD-10-CM

## 2020-01-15 DIAGNOSIS — J9601 Acute respiratory failure with hypoxia: Secondary | ICD-10-CM | POA: Diagnosis not present

## 2020-01-15 HISTORY — PX: ESOPHAGOGASTRODUODENOSCOPY (EGD) WITH PROPOFOL: SHX5813

## 2020-01-15 LAB — CBC
HCT: 43.7 % (ref 36.0–46.0)
Hemoglobin: 14.6 g/dL (ref 12.0–15.0)
MCH: 31.1 pg (ref 26.0–34.0)
MCHC: 33.4 g/dL (ref 30.0–36.0)
MCV: 93 fL (ref 80.0–100.0)
Platelets: 186 10*3/uL (ref 150–400)
RBC: 4.7 MIL/uL (ref 3.87–5.11)
RDW: 13.2 % (ref 11.5–15.5)
WBC: 10.5 10*3/uL (ref 4.0–10.5)
nRBC: 0 % (ref 0.0–0.2)

## 2020-01-15 LAB — BASIC METABOLIC PANEL
Anion gap: 8 (ref 5–15)
BUN: 23 mg/dL (ref 8–23)
CO2: 27 mmol/L (ref 22–32)
Calcium: 9.4 mg/dL (ref 8.9–10.3)
Chloride: 104 mmol/L (ref 98–111)
Creatinine, Ser: 0.92 mg/dL (ref 0.44–1.00)
GFR calc Af Amer: >60 mL/min (ref >60)
GFR calc non Af Amer: 60 mL/min — ABNORMAL LOW (ref >60)
Glucose, Bld: 122 mg/dL — ABNORMAL HIGH (ref 70–99)
Potassium: 3.3 mmol/L — ABNORMAL LOW (ref 3.5–5.1)
Sodium: 139 mmol/L (ref 135–145)

## 2020-01-15 LAB — MAGNESIUM: Magnesium: 2.1 mg/dL (ref 1.7–2.4)

## 2020-01-15 SURGERY — ESOPHAGOGASTRODUODENOSCOPY (EGD) WITH PROPOFOL
Anesthesia: General

## 2020-01-15 MED ORDER — PROPOFOL 500 MG/50ML IV EMUL
INTRAVENOUS | Status: DC | PRN
  Administered 2020-01-15: 100 ug/kg/min via INTRAVENOUS

## 2020-01-15 MED ORDER — POTASSIUM CHLORIDE CRYS ER 20 MEQ PO TBCR
40.0000 meq | EXTENDED_RELEASE_TABLET | Freq: Once | ORAL | Status: AC
  Administered 2020-01-15: 40 meq via ORAL
  Filled 2020-01-15: qty 2

## 2020-01-15 MED ORDER — PROPOFOL 10 MG/ML IV BOLUS
INTRAVENOUS | Status: DC | PRN
  Administered 2020-01-15: 50 mg via INTRAVENOUS

## 2020-01-15 MED ORDER — PANTOPRAZOLE SODIUM 40 MG PO TBEC: 40.0000 mg | DELAYED_RELEASE_TABLET | Freq: Two times a day (BID) | ORAL | 0 refills | Status: AC

## 2020-01-15 MED ORDER — ACETAMINOPHEN 325 MG PO TABS: 650.0000 mg | ORAL_TABLET | Freq: Four times a day (QID) | ORAL | Status: DC | PRN

## 2020-01-15 MED ORDER — LIDOCAINE HCL (CARDIAC) PF 100 MG/5ML IV SOSY
PREFILLED_SYRINGE | INTRAVENOUS | Status: DC | PRN
  Administered 2020-01-15: 50 mg via INTRAVENOUS

## 2020-01-15 MED ORDER — IPRATROPIUM-ALBUTEROL 0.5-2.5 (3) MG/3ML IN SOLN
3.0000 mL | Freq: Two times a day (BID) | RESPIRATORY_TRACT | Status: DC
  Filled 2020-01-15: qty 3

## 2020-01-15 MED ORDER — SODIUM CHLORIDE 0.9 % IV SOLN: INTRAVENOUS | Status: DC

## 2020-01-15 NOTE — Anesthesia Preprocedure Evaluation (Signed)
Anesthesia Evaluation  Patient identified by MRN, date of birth, ID band Patient awake    Reviewed: Allergy & Precautions, H&P , NPO status , Patient's Chart, lab work & pertinent test results  History of Anesthesia Complications (+) PROLONGED EMERGENCE  Airway Mallampati: II  TM Distance: >3 FB Neck ROM: full    Dental  (+) Chipped   Pulmonary sleep apnea ,     + decreased breath sounds      Cardiovascular hypertension, Normal cardiovascular exam Rhythm:regular Rate:Normal     Neuro/Psych PSYCHIATRIC DISORDERS Anxiety Depression negative neurological ROS     GI/Hepatic Neg liver ROS, hiatal hernia, GERD  ,  Endo/Other  negative endocrine ROS  Renal/GU negative Renal ROS  negative genitourinary   Musculoskeletal   Abdominal   Peds  Hematology negative hematology ROS (+)   Anesthesia Other Findings Obese  Past Medical History: No date: Anxiety No date: Complication of anesthesia     Comment:  "hard time waking up after hysterectomy" No date: Depression No date: Hypertension  Past Surgical History: No date: ABDOMINAL HYSTERECTOMY No date: APPENDECTOMY No date: back fusion No date: cataracts No date: INCONTINENCE SURGERY  BMI    Body Mass Index: 36.70 kg/m      Reproductive/Obstetrics negative OB ROS                             Anesthesia Physical Anesthesia Plan  ASA: III  Anesthesia Plan: General   Post-op Pain Management:    Induction:   PONV Risk Score and Plan: Propofol infusion and TIVA  Airway Management Planned: Nasal CPAP  Additional Equipment:   Intra-op Plan:   Post-operative Plan:   Informed Consent: I have reviewed the patients History and Physical, chart, labs and discussed the procedure including the risks, benefits and alternatives for the proposed anesthesia with the patient or authorized representative who has indicated his/her understanding  and acceptance.     Dental Advisory Given  Plan Discussed with: Anesthesiologist, CRNA and Surgeon  Anesthesia Plan Comments:         Anesthesia Quick Evaluation

## 2020-01-15 NOTE — Progress Notes (Signed)
Anesthesiologist was notified that Jessica Leonard drank Metamucil around 0900 this morning. Per Dr. Rosey Bath, her EGD can not be performed until 1500. Patient has been made aware, voiced understanding and is agreeable. Patient's primary RN has been notified as well.

## 2020-01-15 NOTE — Plan of Care (Signed)
  Problem: Education: Goal: Knowledge of General Education information will improve Description: Including pain rating scale, medication(s)/side effects and non-pharmacologic comfort measures Outcome: Progressing   Problem: Health Behavior/Discharge Planning: Goal: Ability to manage health-related needs will improve Outcome: Progressing Note: Patient for an EGD today at 3 PM. Awaiting return to that department. Already consented. Will continue to monitor for procedural results afterwards. Wenda Low [facility]

## 2020-01-15 NOTE — Discharge Instructions (Signed)
Recommend small frequent meals

## 2020-01-15 NOTE — Discharge Summary (Addendum)
[place] [facility] [place] NAME: Jessica Leonard    MR#:  [id number]  [place]:  [birth year]  DATE OF ADMISSION:  01/14/2020 ADMITTING PHYSICIAN: Ivor Costa, MD  DATE OF DISCHARGE: 01/15/2020  PRIMARY CARE PHYSICIAN: System, Pcp Not In    ADMISSION DIAGNOSIS:  Dehydration [E86.0] Epigastric pain [R10.13] Liver lesion [K76.9] Acute respiratory failure with hypoxia (HCC) [J96.01] Nausea and vomiting, intractability of vomiting not specified, unspecified vomiting type [R11.2]  DISCHARGE DIAGNOSIS:  nausea vomiting suspected due to large hiatal hernia/gastritis hypoxia resolved  SECONDARY DIAGNOSIS:   Past Medical History:  Diagnosis Date  . Anxiety   . Complication of anesthesia    "hard time waking up after hysterectomy"  . Depression   . Hypertension     HOSPITAL COURSE:  Jessica Leonard is a 79 y.o. female with medical history significant of hypertension, hyperlipidemia, GERD, depression, hiatal hernia, who presents with nausea, vomiting, abdominal pain and cough. Patient states that she has been having nausea vomiting, abdominal pain for several days, which has progressively worsened today.  The abdominal pain is located in the epigastric area, constant, moderate to severe, sharp, nonradiating.  Nausea/ vomiting with epigastric pain -suspected due to large hiatal hernia with gastritis and incidental finding of gallstones -vomiting resolved -patient was seen by G.I. Dr. Marius Ditch. Pt underwent EGD showed large hiatal hernia with gastric polyps that were biopsied and some retained food. -Dr. Marius Ditch recommends PPI and follow-up biopsy results as outpatient -small frequent meals. -PRN Zofran  Acute respiratory failure with hypoxia [facility]): Etiology is not clear.suspected due to Large HH wih vomitng -CXr neg -  CT angiogram is negative for PE.  - BNP 37, no leg edema, unlikely to have CHF.   -Patient does not have fever or leukocytosis.   -   Possible explanation is due to large hiatal hernia. -sats 94% on RA  HLD (hyperlipidemia) -Lipitor  HTN:  -Continue home medications: Nifedipine -hydralazine prn  GERD (gastroesophageal reflux disease) -protonix  Depression -Cymbalta  Hypokalemia: K= 3.4 on admission. - Repleted - Give 1 g of magnesium sulfate  Hiatal hernia: Patient's abdominal pain is most likely due to hiatal hernia. -Increased Protonix to 40 mg twice daily  Liver lesion: CTA incidentally showed a 3.7 cm x 2.9 cm well-defined area of low attenuation within the anteromedial aspect of the right lobe of the liver. This may represent a cyst or hemangioma.  -UR-RUQ-- shows benign liver cyst. Incidental finding of gallstones noted results were discussed with daughter. -Patient also has a incidental left adrenal mass suggestive of adrenal adenoma. Daughter informed.   Okay from G.I. standpoint to discharge.  DVT ppx: SQ Lovenox Code Status: Full code Family Communication:  Yes, patient's daughter   at bed side Disposition Plan:  Anticipate discharge back to previous environment Consults called: Dr. Marius Ditch of GI Admission status:  obs       Status is: Observation  The patient remains OBS appropriate and will d/c before 2 midnights.  Dispo: The patient is from: Home  Anticipated d/c is to: Home  Anticipated d/c date is: today  Patient currently is medically stable to d/c.       CONSULTS OBTAINED:    DRUG ALLERGIES:   Allergies  Allergen Reactions  . Sulfa Antibiotics     DISCHARGE MEDICATIONS:   Allergies as of 01/15/2020      Reactions   Sulfa Antibiotics       Medication List    TAKE  these medications   acetaminophen 500 MG tablet Commonly known as: TYLENOL Take 500 mg by mouth 2 (two) times daily.   atorvastatin 20 MG tablet Commonly known as: LIPITOR Take 20 mg by mouth daily.   Calcium 600+D 600-800 MG-UNIT  Tabs Generic drug: Calcium Carb-Cholecalciferol Take 1 tablet by mouth daily.   DULoxetine 60 MG capsule Commonly known as: CYMBALTA Take 60 mg by mouth daily.   imipramine 25 MG tablet Commonly known as: TOFRANIL Take 25 mg by mouth at bedtime.   loperamide 2 MG tablet Commonly known as: IMODIUM A-D Take 1 mg by mouth daily.   NIFEdipine 60 MG 24 hr tablet Commonly known as: PROCARDIA XL/NIFEDICAL XL Take 60 mg by mouth daily.   pantoprazole 40 MG tablet Commonly known as: PROTONIX Take 1 tablet (40 mg total) by mouth 2 (two) times daily. What changed: when to take this   PreserVision AREDS 2 Caps Take 1 capsule by mouth 2 (two) times daily.   psyllium 58.6 % packet Commonly known as: METAMUCIL Take 1 packet by mouth daily.       If you experience worsening of your admission symptoms, develop shortness of breath, life threatening emergency, suicidal or homicidal thoughts you must seek medical attention immediately by calling 911 or calling your MD immediately  if symptoms less severe.  You Must read complete instructions/literature along with all the possible adverse reactions/side effects for all the Medicines you take and that have been prescribed to you. Take any new Medicines after you have completely understood and accept all the possible adverse reactions/side effects.   Please note  You were cared for by a hospitalist during your hospital stay. If you have any questions about your discharge medications or the care you received while you were in the hospital after you are discharged, you can call the unit and asked to speak with the hospitalist on call if the hospitalist that took care of you is not available. Once you are discharged, your primary care physician will handle any further medical issues. Please note that NO REFILLS for any discharge medications will be authorized once you are discharged, as it is imperative that you return to your primary care physician  (or establish a relationship with a primary care physician if you do not have one) for your aftercare needs so that they can reassess your need for medications and monitor your lab values. Today   SUBJECTIVE   Denies any vomiting this morning.  VITAL SIGNS:  Blood pressure (!) 146/74, pulse 72, temperature 97.8 F (36.6 C), resp. rate 17, height 5\' 3"  (1.6 m), weight 94 kg, SpO2 94 %.  I/O:    Intake/Output Summary (Last 24 hours) at 01/15/2020 1709 Last data filed at 01/15/2020 1447 [place] per 24 hour  Intake 703 ml  Output 1450 ml  Net -747 ml    PHYSICAL EXAMINATION:  GENERAL:  79 y.o.-year-old patient lying in the bed with no acute distress. Morbid obesity EYES: Pupils equal, round, reactive to light and accommodation. No scleral icterus.  HEENT: Head atraumatic, normocephalic. Oropharynx and nasopharynx clear.  NECK:  Supple, no jugular venous distention. No thyroid enlargement, no tenderness.  LUNGS: Normal breath sounds bilaterally, no wheezing, rales,rhonchi or crepitation. No use of accessory muscles of respiration.  CARDIOVASCULAR: S1, S2 normal. No murmurs, rubs, or gallops.  ABDOMEN: Soft, non-tender, non-distended. Bowel sounds present. No organomegaly or mass.  EXTREMITIES: No pedal edema, cyanosis, or clubbing. DJD changes bilateral knees NEUROLOGIC: Cranial nerves II through  XII are intact. Muscle strength 5/5 in all extremities. Sensation intact. Gait not checked.  PSYCHIATRIC: The patient is alert and oriented x 3.  SKIN: No obvious rash, lesion, or ulcer.   DATA REVIEW:   CBC  Recent Labs  Lab 01/15/20 0428  WBC 10.5  HGB 14.6  HCT 43.7  PLT 186    Chemistries  Recent Labs  Lab 01/14/20 1025 01/14/20 1025 01/15/20 0428  NA 135   < > 139  K 3.4*   < > 3.3*  CL 100   < > 104  CO2 22   < > 27  GLUCOSE 194*   < > 122*  BUN 15   < > 23  CREATININE 0.59   < > 0.92  CALCIUM 9.2   < > 9.4  MG  --   --  2.1  AST 25  --   --   ALT 18  --   --    ALKPHOS 95  --   --   BILITOT 1.3*  --   --    < > = values in this interval not displayed.    Microbiology Results   Recent Results (from the past 240 hour(s))  SARS Coronavirus 2 by RT PCR (hospital order, performed in [facility] hospital lab) Nasopharyngeal Nasopharyngeal Swab     Status: None   Collection Time: 01/14/20  3:55 PM   Specimen: Nasopharyngeal Swab  Result Value Ref Range Status   SARS Coronavirus 2 NEGATIVE NEGATIVE Final    Comment: (NOTE) SARS-CoV-2 target nucleic acids are NOT DETECTED. The SARS-CoV-2 RNA is generally detectable in upper and lower respiratory specimens during the acute phase of infection. The lowest concentration of SARS-CoV-2 viral copies this assay can detect is 250 copies / mL. A negative result does not preclude SARS-CoV-2 infection and should not be used as the sole basis for treatment or other patient management decisions.  A negative result may occur with improper specimen collection / handling, submission of specimen other than nasopharyngeal swab, presence of viral mutation(s) within the areas targeted by this assay, and inadequate number of viral copies (<250 copies / mL). A negative result must be combined with clinical observations, patient history, and epidemiological information. Fact Sheet for Patients:   StrictlyIdeas.no Fact Sheet for Healthcare Providers: BankingDealers.co.za This test is not yet approved or cleared  by the [place] FDA and has been authorized for detection and/or diagnosis of SARS-CoV-2 by FDA under an Emergency Use Authorization (EUA).  This EUA will remain in effect (meaning this test can be used) for the duration of the COVID-19 declaration under Section 564(b)(1) of the Act, 21 U.S.C. section 360bbb-3(b)(1), unless the authorization is terminated or revoked sooner. Performed at [facility], [address] [place],  [address]      RADIOLOGY:  CT Angio Chest PE W/Cm &/Or Wo Cm  Result Date: 01/14/2020 CLINICAL DATA:  Hypoxia. EXAM: CT ANGIOGRAPHY CHEST WITH CONTRAST TECHNIQUE: Multidetector CT imaging of the chest was performed using the standard protocol during bolus administration of intravenous contrast. Multiplanar CT image reconstructions and MIPs were obtained to evaluate the vascular anatomy. CONTRAST:  66mL OMNIPAQUE IOHEXOL 350 MG/ML SOLN COMPARISON:  None. FINDINGS: Cardiovascular: There is moderate to marked severity calcification of the aortic arch. Satisfactory opacification of the pulmonary arteries to the segmental level. No evidence of pulmonary embolism. Normal heart size. No pericardial effusion. Mediastinum/Nodes: No enlarged mediastinal, hilar, or axillary lymph nodes. Thyroid gland, trachea, and esophagus demonstrate no  significant findings. Lungs/Pleura: Mild atelectasis is seen within the bilateral lung bases. There is no evidence of a pleural effusion or pneumothorax. Upper Abdomen: There is a very large gastric hernia. A mild amount of perigastric fluid is seen within this area of herniation on the right. A 3.7 cm x 2.9 cm well-defined area of low attenuation is seen within the anteromedial aspect of the right lobe of the liver. Subcentimeter gallstones are seen within the lumen of an otherwise normal-appearing gallbladder. There is a 2.6 cm x 2.1 cm low-attenuation left adrenal mass. Musculoskeletal: Bilateral metallic density shoulder replacements are seen. Multilevel degenerative changes are seen throughout the thoracic spine. Bilateral pedicle screws are seen within the visualized portion of the upper lumbar spine. Review of the MIP images confirms the above findings. IMPRESSION: 1. No evidence of pulmonary embolism. 2. Very large gastric hernia. 3. Cholelithiasis. 4. 3.7 cm x 2.9 cm well-defined area of low attenuation within the anteromedial aspect of the right lobe of the liver. This may represent a cyst  or hemangioma. Correlation with hepatic ultrasound is recommended. 5. 2.6 cm x 2.1 cm low-attenuation left adrenal mass which may represent an adrenal adenoma. 6. Aortic atherosclerosis. Aortic Atherosclerosis (ICD10-I70.0). Electronically Signed   By: Virgina Norfolk M.D.   On: 01/14/2020 18:12   CT ABDOMEN PELVIS W CONTRAST  Result Date: 01/14/2020 CLINICAL DATA:  Abdominal pain, nausea and vomiting. EXAM: CT ABDOMEN AND PELVIS WITH CONTRAST TECHNIQUE: Multidetector CT imaging of the abdomen and pelvis was performed using the standard protocol following bolus administration of intravenous contrast. CONTRAST:  135mL OMNIPAQUE IOHEXOL 300 MG/ML  SOLN COMPARISON:  None. FINDINGS: Lower chest: Mild volume loss in both lung bases. Pulmonic trunk is enlarged. Atherosclerotic calcification of the aorta and coronary arteries. Heart size is at the upper limits of normal. No pericardial or pleural effusion. Large hiatal hernia contains the majority of the stomach and some ascites. Visualized distal esophagus is fluid-filled and dilated. Hepatobiliary: Low-attenuation lesion in the liver measures 3.7 cm and is likely a cyst. Liver and gallbladder are otherwise unremarkable. No biliary ductal dilatation. Pancreas: Negative. Spleen: Negative. Adrenals/Urinary Tract: Right adrenal gland is unremarkable. A 2.3 cm left adrenal nodule has a washout of 47%. Subcentimeter low-attenuation lesion in the left kidney is too small to characterize but statistically, a cyst is likely. Mild scarring in the left kidney. There may be a punctate stone in the left kidney. Ureters are decompressed. Bladder is grossly unremarkable. Stomach/Bowel: Large hiatal hernia. Stomach, small bowel and colon are unremarkable. Appendix is not readily visualized. Vascular/Lymphatic: Atherosclerotic calcification of the aorta without aneurysm. No pathologically enlarged lymph nodes. Reproductive: Hysterectomy.  No adnexal mass. Other: Small left  periumbilical and right inguinal hernias contain fat. Mesenteries and peritoneum are otherwise unremarkable. Musculoskeletal: Postoperative changes in the spine. Degenerative changes in the thoracolumbar spine. No worrisome lytic or sclerotic lesions. T11 and T12 compression fractures are age indeterminate but likely old. IMPRESSION: 1. No acute findings to explain the patient's symptoms. 2. Large hiatal hernia with a small amount of ascites in the adjacent fat. Visualized distal esophagus is fluid-filled and dilated. 3. Left adrenal adenoma. 4. Possible punctate left renal stone. 5. Aortic atherosclerosis (ICD10-I70.0). Coronary artery calcification. 6. Enlarged pulmonic trunk, indicative of pulmonary arterial hypertension. Electronically Signed   By: Lorin Picket M.D.   On: 01/14/2020 13:13   DG Chest Portable 1 View  Result Date: 01/14/2020 CLINICAL DATA:  Epigastric pain. EXAM: PORTABLE CHEST 1 VIEW COMPARISON:  None. FINDINGS: Large  hiatal hernia. Cardiac silhouette is normal in size. No mediastinal or hilar masses or evidence of adenopathy. Mild linear opacities noted the lung bases consistent with atelectasis. Lungs otherwise clear. No pleural effusion or pneumothorax. Bilateral shoulder prostheses appear well aligned. No acute skeletal abnormality. IMPRESSION: No acute cardiopulmonary disease.  Large hiatal hernia Electronically Signed   By: Lajean Manes M.D.   On: 01/14/2020 14:29   US ABDOMEN LIMITED RUQ  Result Date: 01/14/2020 CLINICAL DATA:  Liver lesion evident on recent CT EXAM: ULTRASOUND ABDOMEN LIMITED RIGHT UPPER QUADRANT COMPARISON:  CT abdomen and pelvis January 14, 2020 FINDINGS: Gallbladder: Within the gallbladder, there are echogenic foci which move and shadow consistent with cholelithiasis. No gallbladder wall thickening or pericholecystic fluid. No sonographic Murphy sign noted by sonographer. Common bile duct: Diameter: 3 mm. No intrahepatic or extrahepatic biliary duct dilatation.  Liver: No there is a cyst in the right lobe of the liver anteriorly measuring 3.9 x 3.9 x 3.1 cm. No focal lesion evident. Within normal limits in parenchymal echogenicity. Portal vein is patent on color Doppler imaging with normal direction of blood flow towards the liver. Other: None. IMPRESSION: 1. Cholelithiasis. No gallbladder wall thickening or pericholecystic fluid. 3.5 x 3.9 x 3.1 cm cyst in the right lobe of the liver anteriorly. No other liver lesions evident. Electronically Signed   By: Lowella Grip III M.D.   On: 01/14/2020 19:49     CODE STATUS:     Code Status Orders  (From admission, onward)         Start     Ordered   01/14/20 1748  Full code  Continuous     01/14/20 1747        Code Status History    This patient has a current code status but no historical code status.   Advance Care Planning Activity       TOTAL TIME TAKING CARE OF THIS PATIENT: *40* minutes.    Fritzi Mandes M.D  [facility]    CC: Primary care physician; System, Pcp Not In

## 2020-01-15 NOTE — Anesthesia Postprocedure Evaluation (Signed)
Anesthesia Post Note  Patient: Jessica Leonard  Procedure(s) Performed: ESOPHAGOGASTRODUODENOSCOPY (EGD) WITH PROPOFOL (N/A )  Patient location during evaluation: PACU Anesthesia Type: General Level of consciousness: awake and alert Pain management: pain level controlled Vital Signs Assessment: post-procedure vital signs reviewed and stable Respiratory status: spontaneous breathing, nonlabored ventilation and respiratory function stable Cardiovascular status: blood pressure returned to baseline and stable Postop Assessment: no apparent nausea or vomiting Anesthetic complications: no     Last Vitals:  Vitals:   01/15/20 1626 01/15/20 1636  BP: 115/73 (!) 114/100  Pulse: 73 76  Resp: (!) 21 19  Temp:    SpO2: 90% 92%    Last Pain:  Vitals:   01/15/20 1636  TempSrc:   PainSc: 0-No pain                 Tera Mater

## 2020-01-15 NOTE — Transfer of Care (Signed)
Immediate Anesthesia Transfer of Care Note  Patient: Jessica Leonard  Procedure(s) Performed: ESOPHAGOGASTRODUODENOSCOPY (EGD) WITH PROPOFOL (N/A )  Patient Location: PACU and Endoscopy Unit  Anesthesia Type:General  Level of Consciousness: awake, alert  and oriented  Airway & Oxygen Therapy: Patient Spontanous Breathing  Post-op Assessment: Report given to RN and Post -op Vital signs reviewed and stable  Post vital signs: Reviewed and stable  Last Vitals:  Vitals Value Taken Time  BP 133/60 01/15/20 1616  Temp 36.5 C 01/15/20 1616  Pulse 76 01/15/20 1617  Resp 22 01/15/20 1617  SpO2 95 % 01/15/20 1617  Vitals shown include unvalidated device data.  Last Pain:  Vitals:   01/15/20 1616  TempSrc: Temporal  PainSc: 0-No pain      Patients Stated Pain Goal: 2 (78/58/85 0277)  Complications: No apparent anesthesia complications

## 2020-01-15 NOTE — Op Note (Signed)
[facility] Gastroenterology Patient Name: Jessica Leonard Procedure Date: 01/15/2020 3:27 PM MRN: [id number] Account #: [id number] Date of Birth: [birth year] Admit Type: Outpatient Age: 79 Room: [facility] ENDO ROOM 1 Gender: Female Note Status: Finalized Procedure:             Upper GI endoscopy Indications:           Epigastric abdominal pain, Nausea with vomiting Providers:             Lin Landsman MD, MD Referring MD:          No Local Md, MD (Referring MD) Medicines:             Monitored Anesthesia Care Complications:         No immediate complications. Estimated blood loss: None. Procedure:             Pre-Anesthesia Assessment:                        - Prior to the procedure, a History and Physical was                         performed, and patient medications and allergies were                         reviewed. The patient is competent. The risks and                         benefits of the procedure and the sedation options and                         risks were discussed with the patient. All questions                         were answered and informed consent was obtained.                         Patient identification and proposed procedure were                         verified by the physician, the nurse, the                         anesthesiologist, the anesthetist and the technician                         in the pre-procedure area in the procedure room in the                         endoscopy suite. Mental Status Examination: alert and                         oriented. Airway Examination: normal oropharyngeal                         airway and neck mobility. Respiratory Examination:                         clear to auscultation. CV Examination: normal.  Prophylactic Antibiotics: The patient does not require                         prophylactic antibiotics. Prior Anticoagulants: The                         patient has taken no  previous anticoagulant or                         antiplatelet agents. ASA Grade Assessment: III - A                         patient with severe systemic disease. After reviewing                         the risks and benefits, the patient was deemed in                         satisfactory condition to undergo the procedure. The                         anesthesia plan was to use monitored anesthesia care                         (MAC). Immediately prior to administration of                         medications, the patient was re-assessed for adequacy                         to receive sedatives. The heart rate, respiratory                         rate, oxygen saturations, blood pressure, adequacy of                         pulmonary ventilation, and response to care were                         monitored throughout the procedure. The physical                         status of the patient was re-assessed after the                         procedure.                        After obtaining informed consent, the endoscope was                         passed under direct vision. Throughout the procedure,                         the patient's blood pressure, pulse, and oxygen                         saturations were monitored continuously. The  Colonoscope was introduced through the mouth, and                         advanced to the second part of duodenum. The upper GI                         endoscopy was accomplished without difficulty. The                         patient tolerated the procedure fairly well. Findings:      The duodenal bulb and second portion of the duodenum were normal.      A large paraesophageal hiatal hernia was present with food bolus in the       hernial sac.      A few 3 to 5 mm sessile polyps with no bleeding and stigmata of recent       bleeding were found in the gastric antrum. Biopsies of th stomach were       taken with a cold forceps for  histology.      A medium amount of food (residue) was found in the gastric fundus.      The gastroesophageal junction and examined esophagus were normal. Impression:            - Normal duodenal bulb and second portion of the                         duodenum.                        - Large hiatal hernia.                        - A few gastric polyps. Biopsied.                        - A medium amount of food (residue) in the stomach.                        - Normal gastroesophageal junction and esophagus. Recommendation:        - Await pathology results.                        - Return patient to hospital ward for possible                         discharge same day.                        - Use Protonix (pantoprazole) 40 mg PO BID for the                         rest of the patient's life.                        - Small frequent meals for the rest of the patient's                         life.                        -  Refer to a cardiothoracic surgeon at appointment to                         be scheduled to evaluate for hernia repair.                        - Return to GI clinic in 6 weeks. Procedure Code(s):     --- Professional ---                        [phone], Esophagogastroduodenoscopy, flexible,                         transoral; with biopsy, single or multiple Diagnosis Code(s):     --- Professional ---                        K44.9, Diaphragmatic hernia without obstruction or                         gangrene                        K31.7, Polyp of stomach and duodenum                        R10.13, Epigastric pain                        R11.2, Nausea with vomiting, unspecified CPT copyright 2019 American Medical Association. All rights reserved. The codes documented in this report are preliminary and upon coder review may  be revised to meet current compliance requirements. Dr. Ulyess Mort Lin Landsman MD, MD 01/15/2020 4:13:42 PM This report has been signed  electronically. Number of Addenda: 0 Note Initiated On: 01/15/2020 3:27 PM Estimated Blood Loss:  Estimated blood loss: none.      [facility]

## 2020-01-16 ENCOUNTER — Encounter: Payer: Self-pay | Admitting: *Deleted

## 2020-01-18 LAB — SURGICAL PATHOLOGY

## 2020-03-21 ENCOUNTER — Ambulatory Visit: Payer: Medicare PPO | Admitting: Gastroenterology

## 2020-04-30 ENCOUNTER — Other Ambulatory Visit: Payer: Self-pay

## 2020-04-30 ENCOUNTER — Encounter: Payer: Self-pay | Admitting: Gastroenterology

## 2020-04-30 ENCOUNTER — Ambulatory Visit: Payer: Medicare PPO | Admitting: Gastroenterology

## 2020-04-30 VITALS — BP 106/67 | HR 77 | Temp 98.1°F | Ht 61.0 in | Wt 204.0 lb

## 2020-04-30 DIAGNOSIS — K449 Diaphragmatic hernia without obstruction or gangrene: Secondary | ICD-10-CM | POA: Diagnosis not present

## 2020-04-30 NOTE — Progress Notes (Signed)
[name], MD [address]  [place]  [place],  [address]  Main: [phone]  Fax: [phone]    Gastroenterology Consultation  Referring Provider:     No ref. provider found Primary Care Physician:  Pcp, No Primary Gastroenterologist:  Dr. [name] Reason for Consultation:     Hospital follow-up, nausea, vomiting        HPI:   Jessica Leonard is a 79 y.o. female referred by Dr. Merryl Hacker, No  for consultation & management of large hiatal hernia.  Patient was admitted to [facility] in 01/2020 secondary to upper abdominal pain, nausea and vomiting.  She presented with hypoxia which was thought to be secondary to large hiatal hernia that was evident on chest x-ray as well as cross-sectional imaging.  CT PE protocol was negative.  Patient does not have any major medical comorbidities other than severe back pain, with fusion that limits her mobility.  Admission which revealed large paraesophageal hernia and food bolus in the hernial sac.  There was evidence of gastric polyps that were biopsied which revealed metaplastic atrophic gastritis.  However, patient does not have anemia. Patient reports having had 2 episodes of sensation of food stuck in her lower chest which spontaneously passed.  She is adherent to pantoprazole 40 mg twice daily.  She is currently asymptomatic.  She reports having regular bowel movements  Patient is accompanied by her daughter today  NSAIDs: None  Antiplts/Anticoagulants/Anti thrombotics: None  GI Procedures:  Upper endoscopy 01/15/2020  - Normal duodenal bulb and second portion of the duodenum. - Large hiatal hernia. - A few gastric polyps. Biopsied. - A medium amount of food (residue) in the stomach. - Normal gastroesophageal junction and esophagus.   DIAGNOSIS:  A. STOMACH, RANDOM; COLD BIOPSY:  - OXYNTIC MUCOSA WITH METAPLASTIC ATROPHIC GASTRITIS, SEE COMMENT.  - ANTRAL MUCOSA WITH NON-SPECIFIC MILD CHRONIC GASTRITIS.  - NEGATIVE  FOR H.PYLORI, DYSPLASIA AND MALIGNANCY  Past Medical History:  Diagnosis Date  . Anxiety   . Complication of anesthesia    "hard time waking up after hysterectomy"  . Depression   . Hypertension     Past Surgical History:  Procedure Laterality Date  . ABDOMINAL HYSTERECTOMY    . APPENDECTOMY    . back fusion    . cataracts    . ESOPHAGOGASTRODUODENOSCOPY (EGD) WITH PROPOFOL N/A 01/15/2020   Procedure: ESOPHAGOGASTRODUODENOSCOPY (EGD) WITH PROPOFOL;  Surgeon: Lin Landsman, MD;  Location: [place];  Service: Gastroenterology;  Laterality: N/A;  . INCONTINENCE SURGERY      Current Outpatient Medications:  .  acetaminophen (TYLENOL) 500 MG tablet, Take 500 mg by mouth 2 (two) times daily., Disp: , Rfl:  .  atorvastatin (LIPITOR) 20 MG tablet, Take 20 mg by mouth daily., Disp: , Rfl:  .  Calcium Carb-Cholecalciferol (CALCIUM 600+D) 600-800 MG-UNIT TABS, Take 1 tablet by mouth daily., Disp: , Rfl:  .  DULoxetine (CYMBALTA) 60 MG capsule, Take 60 mg by mouth daily., Disp: , Rfl:  .  imipramine (TOFRANIL) 25 MG tablet, Take 25 mg by mouth at bedtime., Disp: , Rfl:  .  Multiple Vitamins-Minerals (PRESERVISION AREDS 2) CAPS, Take 1 capsule by mouth 2 (two) times daily., Disp: , Rfl:  .  NIFEdipine (PROCARDIA XL/NIFEDICAL XL) 60 MG 24 hr tablet, Take 60 mg by mouth daily., Disp: , Rfl:  .  pantoprazole (PROTONIX) 40 MG tablet, Take 1 tablet (40 mg total) by mouth 2 (two) times daily., Disp: 60 tablet, Rfl: 0 .  psyllium (METAMUCIL) 58.6 % packet, Take 1 packet by mouth daily., Disp: , Rfl:    Family History  Problem Relation Age of Onset  . Diabetes Mellitus II Father      Social History   Tobacco Use  . Smoking status: Never Smoker  . Smokeless tobacco: Never Used  Substance Use Topics  . Alcohol use: Not Currently  . Drug use: Never    Allergies as of 04/30/2020 - Review Complete 04/30/2020  Allergen Reaction Noted  . Sulfa antibiotics Hives and Rash 04/03/2013   . Amoxicillin Nausea And Vomiting 04/03/2013  . Codeine Nausea Only and Nausea And Vomiting 04/03/2013  . Clindamycin  04/03/2013  . Nsaids Other (See Comments) 07/13/2017  . Other Rash 04/27/2013    Review of Systems:    All systems reviewed and negative except where noted in HPI.   Physical Exam:  BP 106/67 (BP Location: Left Arm, Patient Position: Sitting, Cuff Size: Normal)   Pulse 77   Temp 98.1 F (36.7 C) (Oral)   Ht 5\' 1"  (1.549 m)   Wt 204 lb (92.5 kg)   BMI 38.55 kg/m  No LMP recorded. Patient has had a hysterectomy.  General:   Alert,  Well-developed, well-nourished, pleasant and cooperative in NAD Head:  Normocephalic and atraumatic. Eyes:  Sclera clear, no icterus.   Conjunctiva pink. Ears:  Normal auditory acuity. Nose:  No deformity, discharge, or lesions. Mouth:  No deformity or lesions,oropharynx pink & moist. Neck:  Supple; no masses or thyromegaly. Lungs:  Respirations even and unlabored.  Clear throughout to auscultation.   No wheezes, crackles, or rhonchi. No acute distress. Heart:  Regular rate and rhythm; no murmurs, clicks, rubs, or gallops. Abdomen:  Normal bowel sounds. Soft, non-tender and non-distended without masses, hepatosplenomegaly or hernias noted.  No guarding or rebound tenderness.   Rectal: Not performed Msk:  Symmetrical without gross deformities. Good, equal movement & strength bilaterally. Pulses:  Normal pulses noted. Extremities:  No clubbing or edema.  No cyanosis. Neurologic:  Alert and oriented x3;  grossly normal neurologically. Skin:  Intact without significant lesions or rashes. No jaundice. Lymph Nodes:  No significant cervical adenopathy. Psych:  Alert and cooperative. Normal mood and affect.  Imaging Studies: Reviewed  Assessment and Plan:   Jessica Leonard is a 79 y.o. pleasant Caucasian female with history of chronic back pain, s/p spinal fusion is seen in consultation for large gastric hernia.  Recent  hospitalization at [facility] in 01/2020 for epigastric pain associated with nausea and vomiting s/p EGD which revealed confirmed presence of large paraesophageal with no evidence of ischemia.  Pathology with no evidence of H. 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  Patient is currently asymptomatic Large gastric hernia in the thoracic cavity Recommend referral to cardiothoracic surgeon to evaluate for hernia repair Continue Protonix 40 mg twice daily rest of life Advised regarding small frequent meals, avoid snacking at bedtime, antireflux lifestyle  Metaplastic atrophic gastritis With no evidence of anemia, do not recommend any further work-up at this time   Follow up as needed   [name], MD

## 2020-06-05 DIAGNOSIS — K449 Diaphragmatic hernia without obstruction or gangrene: Secondary | ICD-10-CM | POA: Insufficient documentation

## 2020-06-11 ENCOUNTER — Other Ambulatory Visit: Payer: Self-pay | Admitting: Cardiology

## 2020-06-11 DIAGNOSIS — R0602 Shortness of breath: Secondary | ICD-10-CM

## 2020-06-11 DIAGNOSIS — R0600 Dyspnea, unspecified: Secondary | ICD-10-CM

## 2020-06-11 DIAGNOSIS — R0609 Other forms of dyspnea: Secondary | ICD-10-CM

## 2020-06-23 ENCOUNTER — Other Ambulatory Visit: Payer: Self-pay

## 2020-06-23 ENCOUNTER — Encounter
Admission: RE | Admit: 2020-06-23 | Discharge: 2020-06-23 | Disposition: A | Discharge status: Home / Self Care | Payer: Medicare PPO | Source: Ambulatory Visit | Attending: Cardiology | Admitting: Cardiology

## 2020-06-23 ENCOUNTER — Ambulatory Visit
Admission: RE | Admit: 2020-06-23 | Discharge: 2020-06-23 | Disposition: A | Discharge status: Home / Self Care | Payer: Medicare PPO | Source: Ambulatory Visit | Attending: Cardiology | Admitting: Cardiology

## 2020-06-23 DIAGNOSIS — R0600 Dyspnea, unspecified: Secondary | ICD-10-CM

## 2020-06-23 DIAGNOSIS — F419 Anxiety disorder, unspecified: Secondary | ICD-10-CM | POA: Insufficient documentation

## 2020-06-23 DIAGNOSIS — R0602 Shortness of breath: Secondary | ICD-10-CM

## 2020-06-23 DIAGNOSIS — R0609 Other forms of dyspnea: Secondary | ICD-10-CM

## 2020-06-23 DIAGNOSIS — I10 Essential (primary) hypertension: Secondary | ICD-10-CM | POA: Diagnosis not present

## 2020-06-23 LAB — NM MYOCAR MULTI W/SPECT W/WALL MOTION / EF
Estimated workload: 1 METS
Exercise duration (min): 1 min
Exercise duration (sec): 4 s
LV dias vol: 68 mL (ref 46–106)
LV sys vol: 19 mL
Peak HR: 96 {beats}/min
Rest HR: 67 {beats}/min
SDS: 2
SRS: 9
SSS: 7
TID: 0.9

## 2020-06-23 LAB — ECHOCARDIOGRAM COMPLETE
AR max vel: 2.68 cm2
AV Area VTI: 2.46 cm2
AV Area mean vel: 2.37 cm2
AV Mean grad: 5 mmHg
AV Peak grad: 8 mmHg
Ao pk vel: 1.41 m/s
Area-P 1/2: 3.93 cm2
S' Lateral: 2.04 cm

## 2020-06-23 MED ORDER — REGADENOSON 0.4 MG/5ML IV SOLN
0.4000 mg | Freq: Once | INTRAVENOUS | Status: AC
  Administered 2020-06-23: 0.4 mg via INTRAVENOUS

## 2020-06-23 MED ORDER — TECHNETIUM TC 99M TETROFOSMIN IV KIT
30.0000 | PACK | Freq: Once | INTRAVENOUS | Status: AC | PRN
  Administered 2020-06-23: 29.686 via INTRAVENOUS

## 2020-06-23 MED ORDER — TECHNETIUM TC 99M TETROFOSMIN IV KIT
10.0000 | PACK | Freq: Once | INTRAVENOUS | Status: AC | PRN
  Administered 2020-06-23: 8.75 via INTRAVENOUS

## 2020-06-23 NOTE — Progress Notes (Signed)
*  PRELIMINARY RESULTS* Echocardiogram 2D Echocardiogram has been performed.  Jessica Leonard 06/23/2020, 10:21 AM

## 2020-06-25 DIAGNOSIS — I2510 Atherosclerotic heart disease of native coronary artery without angina pectoris: Secondary | ICD-10-CM | POA: Insufficient documentation

## 2020-07-16 DIAGNOSIS — R21 Rash and other nonspecific skin eruption: Secondary | ICD-10-CM | POA: Insufficient documentation

## 2020-09-15 ENCOUNTER — Other Ambulatory Visit: Payer: Self-pay

## 2020-09-17 ENCOUNTER — Other Ambulatory Visit: Payer: Self-pay

## 2020-09-17 ENCOUNTER — Encounter: Payer: Self-pay | Admitting: Gastroenterology

## 2020-09-17 ENCOUNTER — Ambulatory Visit: Payer: Medicare PPO | Admitting: Gastroenterology

## 2020-09-17 VITALS — BP 121/64 | HR 74 | Temp 99.0°F

## 2020-09-17 DIAGNOSIS — Z9889 Other specified postprocedural states: Secondary | ICD-10-CM

## 2020-09-17 DIAGNOSIS — Z86010 Personal history of colonic polyps: Secondary | ICD-10-CM

## 2020-09-17 DIAGNOSIS — K219 Gastro-esophageal reflux disease without esophagitis: Secondary | ICD-10-CM

## 2020-09-17 DIAGNOSIS — Z8719 Personal history of other diseases of the digestive system: Secondary | ICD-10-CM | POA: Diagnosis not present

## 2020-09-17 MED ORDER — NA SULFATE-K SULFATE-MG SULF 17.5-3.13-1.6 GM/177ML PO SOLN: 354.0000 mL | Freq: Once | ORAL | 0 refills | Status: AC

## 2020-09-17 NOTE — Progress Notes (Signed)
[name], MD [address]  [place]  [place], [place] [address]  Main: [phone]  Fax: [phone]    Gastroenterology Consultation  Referring Provider:     No ref. provider found Primary Care Physician:  Patient, No Pcp Per Primary Gastroenterologist:  Dr. [name] Reason for Consultation: Hiatal hernia, fecal incontinence        HPI:   Jessica Leonard is a 80 y.o. female referred by Dr. Patient, No Pcp Per  for consultation & management of large hiatal hernia.  Patient was admitted to [facility] in 01/2020 secondary to upper abdominal pain, nausea and vomiting.  She presented with hypoxia which was thought to be secondary to large hiatal hernia that was evident on chest x-ray as well as cross-sectional imaging.  CT PE protocol was negative.  Patient does not have any major medical comorbidities other than severe back pain, with fusion that limits her mobility.  Admission which revealed large paraesophageal hernia and food bolus in the hernial sac.  There was evidence of gastric polyps that were biopsied which revealed metaplastic atrophic gastritis.  However, patient does not have anemia. Patient reports having had 2 episodes of sensation of food stuck in her lower chest which spontaneously passed.  She is adherent to pantoprazole 40 mg twice daily.  She is currently asymptomatic.  She reports having regular bowel movements  Follow-up visit 08/17/2020 Patient underwent robotic assisted hiatal hernia repair with [place] fundoplication at [facility] on 24/04/7352.  Patient recovered well from the surgery.  Patient is currently taking Protonix 40 mg daily.  She does report intermittent regurgitation.  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  She tried sacral spinal stimulator, it was eventually removed because of poor healing.  She is also here to discuss about colonoscopy due to personal history of colon polyps.  She reports that she has been  undergoing colonoscopy every 2 to 3 years.  Recently, she developed a rash in her bilateral arms, was started on antihistamines as well as Pepcid.  Her rash has resolved  Patient is accompanied by her daughter today  NSAIDs: None  Antiplts/Anticoagulants/Anti thrombotics: None  GI Procedures:  Upper endoscopy 01/15/2020  - Normal duodenal bulb and second portion of the duodenum. - Large hiatal hernia. - A few gastric polyps. Biopsied. - A medium amount of food (residue) in the stomach. - Normal gastroesophageal junction and esophagus.   DIAGNOSIS:  A. STOMACH, RANDOM; COLD BIOPSY:  - OXYNTIC MUCOSA WITH METAPLASTIC ATROPHIC GASTRITIS, SEE COMMENT.  - ANTRAL MUCOSA WITH NON-SPECIFIC MILD CHRONIC GASTRITIS.  - NEGATIVE FOR H.PYLORI, DYSPLASIA AND MALIGNANCY  Past Medical History:  Diagnosis Date  . Anxiety   . Complication of anesthesia    "hard time waking up after hysterectomy"  . Depression   . Epigastric pain   . Hypertension     Past Surgical History:  Procedure Laterality Date  . ABDOMINAL HYSTERECTOMY    . APPENDECTOMY    . back fusion    . cataracts    . ESOPHAGOGASTRODUODENOSCOPY (EGD) WITH PROPOFOL N/A 01/15/2020   Procedure: ESOPHAGOGASTRODUODENOSCOPY (EGD) WITH PROPOFOL;  Surgeon: Lin Landsman, MD;  Location: [place];  Service: Gastroenterology;  Laterality: N/A;  . INCONTINENCE SURGERY      Current Outpatient Medications:  .  acetaminophen (TYLENOL) 500 MG tablet, Take 500 mg by mouth 2 (two) times daily., Disp: , Rfl:  .  atorvastatin (LIPITOR) 20 MG tablet, Take 20 mg by mouth  daily., Disp: , Rfl:  .  Calcium Carb-Cholecalciferol (CALCIUM 600+D) 600-800 MG-UNIT TABS, Take 1 tablet by mouth daily., Disp: , Rfl:  .  Cholecalciferol 25 MCG (1000 UT) tablet, Take by mouth., Disp: , Rfl:  .  Cyanocobalamin 1000 MCG SUBL, Place under the tongue., Disp: , Rfl:  .  DULoxetine (CYMBALTA) 60 MG capsule, Take 60 mg by mouth daily., Disp: , Rfl:  .   imipramine (TOFRANIL) 25 MG tablet, Take 25 mg by mouth at bedtime., Disp: , Rfl:  .  Multiple Vitamins-Minerals (PRESERVISION AREDS 2) CAPS, Take 1 capsule by mouth 2 (two) times daily., Disp: , Rfl:  .  Na Sulfate-K Sulfate-Mg Sulf 17.5-3.13-1.6 GM/177ML SOLN, Take 354 mLs by mouth once for 1 dose., Disp: 354 mL, Rfl: 0 .  NIFEdipine (PROCARDIA XL/NIFEDICAL XL) 60 MG 24 hr tablet, Take 60 mg by mouth daily., Disp: , Rfl:  .  pantoprazole (PROTONIX) 40 MG tablet, Take 1 tablet (40 mg total) by mouth 2 (two) times daily., Disp: 60 tablet, Rfl: 0 .  psyllium (METAMUCIL) 58.6 % packet, Take 1 packet by mouth daily., Disp: , Rfl:    Family History  Problem Relation Age of Onset  . Diabetes Mellitus II Father      Social History   Tobacco Use  . Smoking status: Never Smoker  . Smokeless tobacco: Never Used  Substance Use Topics  . Alcohol use: Not Currently  . Drug use: Never    Allergies as of 09/17/2020 - Review Complete 09/17/2020  Allergen Reaction Noted  . Sulfa antibiotics Hives and Rash 04/03/2013  . Amoxicillin Nausea And Vomiting 04/03/2013  . Codeine Nausea Only and Nausea And Vomiting 04/03/2013  . Clindamycin  04/03/2013  . Nsaids Other (See Comments) 07/13/2017  . Other Rash 04/27/2013    Review of Systems:    All systems reviewed and negative except where noted in HPI.   Physical Exam:  BP 121/64 (BP Location: Left Arm, Patient Position: Sitting, Cuff Size: Normal)   Pulse 74   Temp 99 F (37.2 C) (Oral)  No LMP recorded. Patient has had a hysterectomy.  General:   Alert,  Well-developed, well-nourished, pleasant and cooperative in NAD Head:  Normocephalic and atraumatic. Eyes:  Sclera clear, no icterus.   Conjunctiva pink. Ears:  Normal auditory acuity. Nose:  No deformity, discharge, or lesions. Mouth:  No deformity or lesions,oropharynx pink & moist. Neck:  Supple; no masses or thyromegaly. Lungs:  Respirations even and unlabored.  Clear throughout to  auscultation.   No wheezes, crackles, or rhonchi. No acute distress. Heart:  Regular rate and rhythm; no murmurs, clicks, rubs, or gallops. Abdomen:  Normal bowel sounds. Soft, non-tender and non-distended without masses, hepatosplenomegaly or hernias noted.  No guarding or rebound tenderness.   Rectal: Not performed Msk:  Symmetrical without gross deformities. Good, equal movement & strength bilaterally. Pulses:  Normal pulses noted. Extremities:  No clubbing or edema.  No cyanosis. Neurologic:  Alert and oriented x3;  grossly normal neurologically. Skin:  Intact without significant lesions or rashes. No jaundice. Psych:  Alert and cooperative. Normal mood and affect.  Imaging Studies: Reviewed  Assessment and Plan:   Jessica Leonard is a 80 y.o. pleasant Caucasian female with history of chronic back pain, s/p spinal fusion fecal incontinence, large paraesophageal hernia s/p robotic assisted hernia repair and fundoplication in 38/25 at [facility] is seen for follow-up.  S/p EGD, pathology with no evidence of H. 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  History of hiatal hernia Patient reports intermittent regurgitation Continue Protonix 40 mg 1-2 times daily Recommend EGD for follow-up post hernia repair  Personal history of colon polyps Recommend surveillance colonoscopy  Metaplastic atrophic gastritis With no evidence of anemia, do not recommend any further work-up at this time   Follow up as needed   [name], MD

## 2020-10-06 ENCOUNTER — Other Ambulatory Visit: Admission: RE | Admit: 2020-10-06 | Payer: Medicare PPO | Source: Ambulatory Visit

## 2020-10-07 ENCOUNTER — Telehealth: Payer: Self-pay | Admitting: Gastroenterology

## 2020-10-07 ENCOUNTER — Other Ambulatory Visit
Admission: RE | Admit: 2020-10-07 | Discharge: 2020-10-07 | Disposition: A | Discharge status: Home / Self Care | Payer: Medicare PPO | Source: Ambulatory Visit | Attending: Gastroenterology | Admitting: Gastroenterology

## 2020-10-07 ENCOUNTER — Other Ambulatory Visit: Payer: Self-pay

## 2020-10-07 ENCOUNTER — Encounter: Payer: Self-pay | Admitting: Gastroenterology

## 2020-10-07 DIAGNOSIS — Z20822 Contact with and (suspected) exposure to covid-19: Secondary | ICD-10-CM | POA: Diagnosis not present

## 2020-10-07 DIAGNOSIS — Z01812 Encounter for preprocedural laboratory examination: Secondary | ICD-10-CM | POA: Insufficient documentation

## 2020-10-07 LAB — SARS CORONAVIRUS 2 (TAT 6-24 HRS): SARS Coronavirus 2: NEGATIVE

## 2020-10-07 NOTE — Telephone Encounter (Signed)
Medication was called in for patient on 09/17/2020. Pharmacy states they only keep medication filled for 9 days and she never came to pick up the medication. She states she will fill it. Informed patient by a detail message

## 2020-10-07 NOTE — Telephone Encounter (Signed)
Patient called and asked for prep to be called into [facility] on [address]. Procedure is scheduled for tomorrow. Please call patient

## 2020-10-08 ENCOUNTER — Ambulatory Visit: Payer: Medicare PPO | Admitting: Anesthesiology

## 2020-10-08 ENCOUNTER — Other Ambulatory Visit: Payer: Self-pay

## 2020-10-08 ENCOUNTER — Ambulatory Visit
Admission: RE | Admit: 2020-10-08 | Discharge: 2020-10-08 | Disposition: A | Discharge status: Home / Self Care | Payer: Medicare PPO | Attending: Gastroenterology | Admitting: Gastroenterology

## 2020-10-08 ENCOUNTER — Encounter: Payer: Self-pay | Admitting: Gastroenterology

## 2020-10-08 ENCOUNTER — Encounter
Admission: RE | Disposition: A | Discharge status: Home / Self Care | Payer: Self-pay | Source: Home / Self Care | Attending: Gastroenterology

## 2020-10-08 DIAGNOSIS — K31A11 Gastric intestinal metaplasia without dysplasia, involving the antrum: Secondary | ICD-10-CM | POA: Diagnosis not present

## 2020-10-08 DIAGNOSIS — K317 Polyp of stomach and duodenum: Secondary | ICD-10-CM | POA: Insufficient documentation

## 2020-10-08 DIAGNOSIS — Z888 Allergy status to other drugs, medicaments and biological substances status: Secondary | ICD-10-CM | POA: Diagnosis not present

## 2020-10-08 DIAGNOSIS — Z87891 Personal history of nicotine dependence: Secondary | ICD-10-CM | POA: Diagnosis not present

## 2020-10-08 DIAGNOSIS — Z1211 Encounter for screening for malignant neoplasm of colon: Secondary | ICD-10-CM | POA: Diagnosis not present

## 2020-10-08 DIAGNOSIS — K319 Disease of stomach and duodenum, unspecified: Secondary | ICD-10-CM | POA: Insufficient documentation

## 2020-10-08 DIAGNOSIS — Z791 Long term (current) use of non-steroidal anti-inflammatories (NSAID): Secondary | ICD-10-CM | POA: Insufficient documentation

## 2020-10-08 DIAGNOSIS — Z860100 Personal history of colon polyps, unspecified: Secondary | ICD-10-CM

## 2020-10-08 DIAGNOSIS — Z880 Allergy status to penicillin: Secondary | ICD-10-CM | POA: Insufficient documentation

## 2020-10-08 DIAGNOSIS — Z882 Allergy status to sulfonamides status: Secondary | ICD-10-CM | POA: Insufficient documentation

## 2020-10-08 DIAGNOSIS — Z833 Family history of diabetes mellitus: Secondary | ICD-10-CM | POA: Insufficient documentation

## 2020-10-08 DIAGNOSIS — Z79899 Other long term (current) drug therapy: Secondary | ICD-10-CM | POA: Insufficient documentation

## 2020-10-08 DIAGNOSIS — K635 Polyp of colon: Secondary | ICD-10-CM

## 2020-10-08 DIAGNOSIS — Z9889 Other specified postprocedural states: Secondary | ICD-10-CM | POA: Insufficient documentation

## 2020-10-08 DIAGNOSIS — Z885 Allergy status to narcotic agent status: Secondary | ICD-10-CM | POA: Diagnosis not present

## 2020-10-08 DIAGNOSIS — K2950 Unspecified chronic gastritis without bleeding: Secondary | ICD-10-CM | POA: Insufficient documentation

## 2020-10-08 DIAGNOSIS — K644 Residual hemorrhoidal skin tags: Secondary | ICD-10-CM | POA: Diagnosis not present

## 2020-10-08 DIAGNOSIS — D123 Benign neoplasm of transverse colon: Secondary | ICD-10-CM | POA: Diagnosis not present

## 2020-10-08 DIAGNOSIS — K219 Gastro-esophageal reflux disease without esophagitis: Secondary | ICD-10-CM | POA: Insufficient documentation

## 2020-10-08 DIAGNOSIS — K5730 Diverticulosis of large intestine without perforation or abscess without bleeding: Secondary | ICD-10-CM | POA: Insufficient documentation

## 2020-10-08 HISTORY — PX: COLONOSCOPY WITH PROPOFOL: SHX5780

## 2020-10-08 HISTORY — PX: ESOPHAGOGASTRODUODENOSCOPY (EGD) WITH PROPOFOL: SHX5813

## 2020-10-08 SURGERY — COLONOSCOPY WITH PROPOFOL
Anesthesia: General

## 2020-10-08 MED ORDER — PROPOFOL 10 MG/ML IV BOLUS
INTRAVENOUS | Status: DC | PRN
  Administered 2020-10-08: 70 mg via INTRAVENOUS

## 2020-10-08 MED ORDER — PROPOFOL 500 MG/50ML IV EMUL
INTRAVENOUS | Status: AC
  Filled 2020-10-08: qty 50

## 2020-10-08 MED ORDER — LIDOCAINE 2% (20 MG/ML) 5 ML SYRINGE
INTRAMUSCULAR | Status: DC | PRN
  Administered 2020-10-08: 100 mg via INTRAVENOUS

## 2020-10-08 MED ORDER — PROPOFOL 500 MG/50ML IV EMUL
INTRAVENOUS | Status: DC | PRN
  Administered 2020-10-08: 150 ug/kg/min via INTRAVENOUS

## 2020-10-08 MED ORDER — SODIUM CHLORIDE 0.9 % IV SOLN: INTRAVENOUS | Status: DC

## 2020-10-08 MED ORDER — LIDOCAINE HCL (PF) 2 % IJ SOLN
INTRAMUSCULAR | Status: AC
  Filled 2020-10-08: qty 5

## 2020-10-08 NOTE — Op Note (Signed)
[facility] Gastroenterology Patient Name: Jessica Leonard Procedure Date: 10/08/2020 9:37 AM MRN: [id number] Account #: [id number] Date of Birth: 07/07/1941 Admit Type: Outpatient Age: 80 Room: [facility] ENDO ROOM 3 Gender: Female Note Status: Finalized Procedure:             Colonoscopy Indications:           Surveillance: Personal history of adenomatous polyps                         on last colonoscopy > 5 years ago Providers:             Lin Landsman MD, MD Medicines:             General Anesthesia Complications:         No immediate complications. Estimated blood loss: None. Procedure:             Pre-Anesthesia Assessment:                        - Prior to the procedure, a History and Physical was                         performed, and patient medications and allergies were                         reviewed. The patient is competent. The risks and                         benefits of the procedure and the sedation options and                         risks were discussed with the patient. All questions                         were answered and informed consent was obtained.                         Patient identification and proposed procedure were                         verified by the physician, the nurse, the                         anesthesiologist, the anesthetist and the technician                         in the pre-procedure area in the procedure room in the                         endoscopy suite. Mental Status Examination: alert and                         oriented. Airway Examination: normal oropharyngeal                         airway and neck mobility. Respiratory Examination:                         clear to auscultation. CV Examination:  normal.                         Prophylactic Antibiotics: The patient does not require                         prophylactic antibiotics. Prior Anticoagulants: The                         patient has taken no  previous anticoagulant or                         antiplatelet agents. ASA Grade Assessment: III - A                         patient with severe systemic disease. After reviewing                         the risks and benefits, the patient was deemed in                         satisfactory condition to undergo the procedure. The                         anesthesia plan was to use general anesthesia.                         Immediately prior to administration of medications,                         the patient was re-assessed for adequacy to receive                         sedatives. The heart rate, respiratory rate, oxygen                         saturations, blood pressure, adequacy of pulmonary                         ventilation, and response to care were monitored                         throughout the procedure. The physical status of the                         patient was re-assessed after the procedure.                        After obtaining informed consent, the colonoscope was                         passed under direct vision. Throughout the procedure,                         the patient's blood pressure, pulse, and oxygen                         saturations were monitored continuously. The  Colonoscope was introduced through the anus and                         advanced to the the cecum, identified by appendiceal                         orifice and ileocecal valve. The colonoscopy was                         performed without difficulty. The patient tolerated                         the procedure well. The quality of the bowel                         preparation was fair. Findings:      Hemorrhoids were found on perianal exam.      A 5 mm polyp was found in the transverse colon. The polyp was sessile.       The polyp was removed with a cold snare. Resection and retrieval were       complete.      Multiple diverticula were found in the sigmoid colon.       Non-bleeding external hemorrhoids were found during retroflexion. The       hemorrhoids were large. Impression:            - Preparation of the colon was fair.                        - Hemorrhoids found on perianal exam.                        - One 5 mm polyp in the transverse colon, removed with                         a cold snare. Resected and retrieved.                        - Diverticulosis in the sigmoid colon.                        - Non-bleeding external hemorrhoids. Recommendation:        - Discharge patient to home (with escort).                        - Resume previous diet today.                        - Continue present medications.                        - Await pathology results. Procedure Code(s):     --- Professional ---                        [phone], Colonoscopy, flexible; with removal of                         tumor(s), polyp(s), or other lesion(s) by snare  technique Diagnosis Code(s):     --- Professional ---                        Z86.010, Personal history of colonic polyps                        K64.4, Residual hemorrhoidal skin tags                        K63.5, Polyp of colon                        K57.30, Diverticulosis of large intestine without                         perforation or abscess without bleeding CPT copyright 2019 American Medical Association. All rights reserved. The codes documented in this report are preliminary and upon coder review may  be revised to meet current compliance requirements. Dr. Ulyess Mort Lin Landsman MD, MD 10/08/2020 10:43:08 AM This report has been signed electronically. Number of Addenda: 0 Note Initiated On: 10/08/2020 9:37 AM Scope Withdrawal Time: 0 hours 12 minutes 24 seconds  Total Procedure Duration: 0 hours 17 minutes 32 seconds  Estimated Blood Loss:  Estimated blood loss: none.      [facility]

## 2020-10-08 NOTE — Anesthesia Preprocedure Evaluation (Signed)
Anesthesia Evaluation  Patient identified by MRN, date of birth, ID band Patient awake    Reviewed: Allergy & Precautions, H&P , NPO status , Patient's Chart, lab work & pertinent test results  History of Anesthesia Complications (+) PROLONGED EMERGENCE and history of anesthetic complications  Airway Mallampati: II  TM Distance: >3 FB Neck ROM: full    Dental  (+) Chipped, Dental Advidsory Given   Pulmonary neg shortness of breath, sleep apnea , neg COPD, neg recent URI, former smoker,     + decreased breath sounds      Cardiovascular hypertension, (-) angina+ CAD  (-) Past MI and (-) Cardiac Stents Normal cardiovascular exam(-) dysrhythmias (-) Valvular Problems/Murmurs Rhythm:regular Rate:Normal     Neuro/Psych PSYCHIATRIC DISORDERS Anxiety Depression negative neurological ROS     GI/Hepatic Neg liver ROS, hiatal hernia, GERD  ,  Endo/Other  negative endocrine ROS  Renal/GU negative Renal ROS  negative genitourinary   Musculoskeletal   Abdominal   Peds  Hematology negative hematology ROS (+)   Anesthesia Other Findings Obese  Past Medical History: No date: Anxiety No date: Complication of anesthesia     Comment:  "hard time waking up after hysterectomy" No date: Depression No date: Hypertension  Past Surgical History: No date: ABDOMINAL HYSTERECTOMY No date: APPENDECTOMY No date: back fusion No date: cataracts No date: INCONTINENCE SURGERY  BMI    Body Mass Index: 36.70 kg/m      Reproductive/Obstetrics negative OB ROS                             Anesthesia Physical  Anesthesia Plan  ASA: III  Anesthesia Plan: General   Post-op Pain Management:    Induction: Intravenous  PONV Risk Score and Plan: Propofol infusion and TIVA  Airway Management Planned: Natural Airway and Nasal Cannula  Additional Equipment:   Intra-op Plan:   Post-operative Plan:    Informed Consent: I have reviewed the patients History and Physical, chart, labs and discussed the procedure including the risks, benefits and alternatives for the proposed anesthesia with the patient or authorized representative who has indicated his/her understanding and acceptance.     Dental Advisory Given  Plan Discussed with: Anesthesiologist, CRNA and Surgeon  Anesthesia Plan Comments:         Anesthesia Quick Evaluation

## 2020-10-08 NOTE — Anesthesia Postprocedure Evaluation (Signed)
Anesthesia Post Note  Patient: Jessica Leonard  Procedure(s) Performed: COLONOSCOPY WITH PROPOFOL (N/A ) ESOPHAGOGASTRODUODENOSCOPY (EGD) WITH PROPOFOL (N/A )  Patient location during evaluation: Endoscopy Anesthesia Type: General Level of consciousness: awake and alert Pain management: pain level controlled Vital Signs Assessment: post-procedure vital signs reviewed and stable Respiratory status: spontaneous breathing, nonlabored ventilation, respiratory function stable and patient connected to nasal cannula oxygen Cardiovascular status: blood pressure returned to baseline and stable Postop Assessment: no apparent nausea or vomiting Anesthetic complications: no   No complications documented.   Last Vitals:  Vitals:   10/08/20 1100 10/08/20 1110  BP: (!) 155/83 (!) 153/83  Pulse: 75 73  Resp: 19 16  Temp:    SpO2: 97% 96%    Last Pain:  Vitals:   10/08/20 0909  TempSrc: Temporal  PainSc: 0-No pain                 Martha Clan

## 2020-10-08 NOTE — Op Note (Signed)
[facility] Gastroenterology Patient Name: Jessica Leonard Procedure Date: 10/08/2020 9:42 AM MRN: [id number] Account #: [id number] Date of Birth: 11/28/1940 Admit Type: Outpatient Age: 80 Room: [facility] ENDO ROOM 3 Gender: Female Note Status: Finalized Procedure:             Upper GI endoscopy Indications:           Regurgitation, h/o hiatal hernia repair, s/p toupet                         fundoplication Providers:             Lin Landsman MD, MD Medicines:             General Anesthesia Complications:         No immediate complications. Estimated blood loss:                         Minimal. Procedure:             Pre-Anesthesia Assessment:                        - Prior to the procedure, a History and Physical was                         performed, and patient medications and allergies were                         reviewed. The patient is competent. The risks and                         benefits of the procedure and the sedation options and                         risks were discussed with the patient. All questions                         were answered and informed consent was obtained.                         Patient identification and proposed procedure were                         verified by the physician, the nurse, the                         anesthesiologist, the anesthetist and the technician                         in the pre-procedure area in the procedure room in the                         endoscopy suite. Mental Status Examination: alert and                         oriented. Airway Examination: normal oropharyngeal                         airway and neck mobility. Respiratory Examination:  clear to auscultation. CV Examination: normal.                         Prophylactic Antibiotics: The patient does not require                         prophylactic antibiotics. Prior Anticoagulants: The                         patient has  taken no previous anticoagulant or                         antiplatelet agents. ASA Grade Assessment: III - A                         patient with severe systemic disease. After reviewing                         the risks and benefits, the patient was deemed in                         satisfactory condition to undergo the procedure. The                         anesthesia plan was to use general anesthesia.                         Immediately prior to administration of medications,                         the patient was re-assessed for adequacy to receive                         sedatives. The heart rate, respiratory rate, oxygen                         saturations, blood pressure, adequacy of pulmonary                         ventilation, and response to care were monitored                         throughout the procedure. The physical status of the                         patient was re-assessed after the procedure.                        After obtaining informed consent, the endoscope was                         passed under direct vision. Throughout the procedure,                         the patient's blood pressure, pulse, and oxygen                         saturations were monitored continuously. The Endoscope  was introduced through the mouth, and advanced to the                         second part of duodenum. The upper GI endoscopy was                         accomplished without difficulty. The patient tolerated                         the procedure well. Findings:      The duodenal bulb and second portion of the duodenum were normal.      Diffuse moderately erythematous mucosa without bleeding was found in the       entire examined stomach. Biopsies were taken with a cold forceps for       histology.      Multiple 12 mm pedunculated and sessile polyps with no bleeding and no       stigmata of recent bleeding were found in the stomach. The polyp was        removed with a hot snare. Resection and retrieval were complete. To       prevent bleeding after the polypectomy, one hemostatic clip was       successfully placed. There was no bleeding during, or at the end, of the       procedure.      Evidence of a Toupet fundoplication was found in the gastric fundus. The       wrap appeared intact. This was traversed.      The gastroesophageal junction and examined esophagus were normal. Impression:            - Normal duodenal bulb and second portion of the                         duodenum.                        - Erythematous mucosa in the stomach. Biopsied.                        - Multiple gastric polyps. Resected and retrieved.                         Clip was placed.                        - A Toupet fundoplication was found. The wrap appears                         intact.                        - Normal gastroesophageal junction and esophagus. Recommendation:        - Continue present medications.                        - Await pathology results.                        - Proceed with colonoscopy as scheduled  See colonoscopy report Procedure Code(s):     --- Professional ---                        [phone], Esophagogastroduodenoscopy, flexible,                         transoral; with removal of tumor(s), polyp(s), or                         other lesion(s) by snare technique                        43239, 20, Esophagogastroduodenoscopy, flexible,                         transoral; with biopsy, single or multiple Diagnosis Code(s):     --- Professional ---                        K31.89, Other diseases of stomach and duodenum                        K31.7, Polyp of stomach and duodenum                        Z98.890, Other specified postprocedural states                        R11.10, Vomiting, unspecified CPT copyright 2019 American Medical Association. All rights reserved. The codes documented in this report are  preliminary and upon coder review may  be revised to meet current compliance requirements. Dr. Ulyess Mort Lin Landsman MD, MD 10/08/2020 10:14:04 AM This report has been signed electronically. Number of Addenda: 0 Note Initiated On: 10/08/2020 9:42 AM Estimated Blood Loss:  Estimated blood loss was minimal.      [facility]

## 2020-10-08 NOTE — H&P (Signed)
[name], MD [address]  [place], [place] [address]  Main: [phone]  Fax: [phone] Pager: [phone]  Primary Care Physician:  Patient, No Pcp Per Primary Gastroenterologist:  Dr. [name]  Pre-Procedure History & Physical: HPI:  Jessica Leonard is a 80 y.o. female is here for an endoscopy and colonoscopy.   Past Medical History:  Diagnosis Date  . Anxiety   . Complication of anesthesia    "hard time waking up after hysterectomy"  . Depression   . Epigastric pain   . Hypertension     Past Surgical History:  Procedure Laterality Date  . ABDOMINAL HYSTERECTOMY    . APPENDECTOMY    . back fusion    . BACK SURGERY    . BUNIONECTOMY    . cataracts    . ESOPHAGOGASTRODUODENOSCOPY (EGD) WITH PROPOFOL N/A 01/15/2020   Procedure: ESOPHAGOGASTRODUODENOSCOPY (EGD) WITH PROPOFOL;  Surgeon: Lin Landsman, MD;  Location: [facility];  Service: Gastroenterology;  Laterality: N/A;  . EYE SURGERY    . INCONTINENCE SURGERY    . TOTAL SHOULDER ARTHROPLASTY Bilateral     Prior to Admission medications   Medication Sig Start Date End Date Taking? Authorizing Provider  acetaminophen (TYLENOL) 500 MG tablet Take 500 mg by mouth 2 (two) times daily.    [provider]  atorvastatin (LIPITOR) 20 MG tablet Take 20 mg by mouth daily. 12/26/19   [provider]  Calcium Carb-Cholecalciferol (CALCIUM 600+D) 600-800 MG-UNIT TABS Take 1 tablet by mouth daily.    [provider]  Cholecalciferol 25 MCG (1000 UT) tablet Take by mouth.    [provider]  Cyanocobalamin 1000 MCG SUBL Place under the tongue.    [provider]  DULoxetine (CYMBALTA) 60 MG capsule Take 60 mg by mouth daily. 12/26/19   [provider]  imipramine (TOFRANIL) 25 MG tablet Take 25 mg by mouth at bedtime. 12/31/19   [provider]  Multiple Vitamins-Minerals (PRESERVISION AREDS 2) CAPS Take 1 capsule by mouth 2 (two)  times daily.    [provider]  NIFEdipine (PROCARDIA XL/NIFEDICAL XL) 60 MG 24 hr tablet Take 60 mg by mouth daily. 12/26/19   [provider]  pantoprazole (PROTONIX) 40 MG tablet Take 1 tablet (40 mg total) by mouth 2 (two) times daily. 01/15/20   Fritzi Mandes, MD  psyllium (METAMUCIL) 58.6 % packet Take 1 packet by mouth daily.    [provider]    Allergies as of 09/17/2020 - Review Complete 09/17/2020  Allergen Reaction Noted  . Sulfa antibiotics Hives and Rash 04/03/2013  . Amoxicillin Nausea And Vomiting 04/03/2013  . Codeine Nausea Only and Nausea And Vomiting 04/03/2013  . Clindamycin  04/03/2013  . Nsaids Other (See Comments) 07/13/2017  . Other Rash 04/27/2013    Family History  Problem Relation Age of Onset  . Diabetes Mellitus II Father     Social History   Socioeconomic History  . Marital status: Divorced    Spouse name: Not on file  . Number of children: Not on file  . Years of education: Not on file  . Highest education level: Not on file  Occupational History  . Not on file  Tobacco Use  . Smoking status: Former Smoker    Types: Cigarettes    Quit date: 2011    Years since quitting: 11.1  . Smokeless tobacco: Never Used  Vaping Use  . Vaping Use: Never used  Substance and Sexual  Activity  . Alcohol use: Not Currently  . Drug use: Never  . Sexual activity: Not on file  Other Topics Concern  . Not on file  Social History Narrative  . Not on file   Social Determinants of Health   Financial Resource Strain: Not on file  Food Insecurity: Not on file  Transportation Needs: Not on file  Physical Activity: Not on file  Stress: Not on file  Social Connections: Not on file  Intimate Partner Violence: Not on file    Review of Systems: See HPI, otherwise negative ROS  Physical Exam: BP 109/64   Pulse 90   Temp (!) 96.6 F (35.9 C) (Temporal)   Resp 18   Ht 5\' 3"  (1.6 m)   Wt 90.7 kg   SpO2 95%   BMI 35.43 kg/m   General:   Alert,  pleasant and cooperative in NAD Head:  Normocephalic and atraumatic. Neck:  Supple; no masses or thyromegaly. Lungs:  Clear throughout to auscultation.    Heart:  Regular rate and rhythm. Abdomen:  Soft, nontender and nondistended. Normal bowel sounds, without guarding, and without rebound.   Neurologic:  Alert and  oriented x4;  grossly normal neurologically.  Impression/Plan: Jessica Leonard is here for an endoscopy and colonoscopy to be performed for regurgitation post hernia repair, h/o colon polyps  Risks, benefits, limitations, and alternatives regarding  endoscopy and colonoscopy have been reviewed with the patient.  Questions have been answered.  All parties agreeable.   Sherri Sear, MD  10/08/2020, 9:13 AM

## 2020-10-08 NOTE — Transfer of Care (Signed)
Immediate Anesthesia Transfer of Care Note  Patient: Jessica Leonard  Procedure(s) Performed: COLONOSCOPY WITH PROPOFOL (N/A ) ESOPHAGOGASTRODUODENOSCOPY (EGD) WITH PROPOFOL (N/A )  Patient Location: Endoscopy Unit  Anesthesia Type:General  Level of Consciousness: sedated  Airway & Oxygen Therapy: Patient Spontanous Breathing  Post-op Assessment: Post -op Vital signs reviewed and stable  Post vital signs: stable  Last Vitals:  Vitals Value Taken Time  BP 119/69 10/08/20 1040  Temp    Pulse 77 10/08/20 1040  Resp 13 10/08/20 1040  SpO2 97 % 10/08/20 1040  Vitals shown include unvalidated device data.  Last Pain:  Vitals:   10/08/20 0909  TempSrc: Temporal  PainSc: 0-No pain         Complications: No complications documented.

## 2020-10-09 ENCOUNTER — Encounter: Payer: Self-pay | Admitting: Gastroenterology

## 2020-10-10 LAB — SURGICAL PATHOLOGY

## 2020-10-14 ENCOUNTER — Encounter: Payer: Self-pay | Admitting: Gastroenterology

## 2021-08-31 IMAGING — CT CT ANGIO CHEST
2 of 6 series · 18 of 46 positions shown · IV contrast (APPLIED)
Comparison: None.

CLINICAL DATA: Hypoxia.

EXAM:
CT ANGIOGRAPHY CHEST WITH CONTRAST
TECHNIQUE: Multidetector CT imaging of the chest was performed using the
standard protocol during bolus administration of intravenous
contrast. Multiplanar CT image reconstructions and MIPs were
obtained to evaluate the vascular anatomy.
CONTRAST:  75mL OMNIPAQUE IOHEXOL 350 MG/ML SOLN

[Series 5: thins · axial · 0.80mm/px · z∈[-336,-67]mm · 15 of 295 slices shown]
[im 13/295  lung]
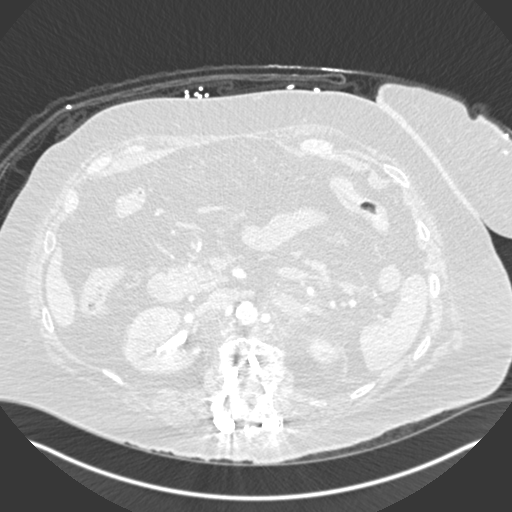
[im 39/295  soft-tissue]
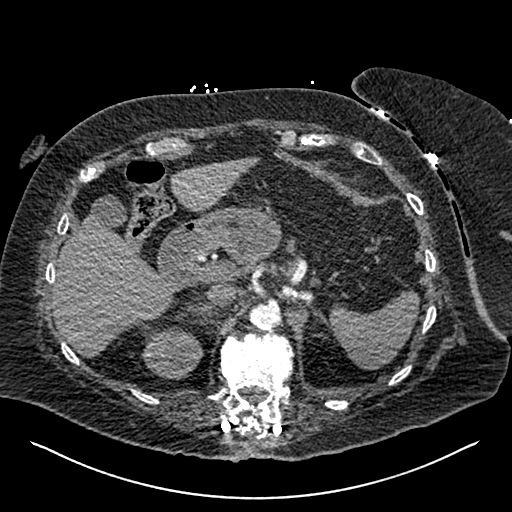
[im 52/295  lung]
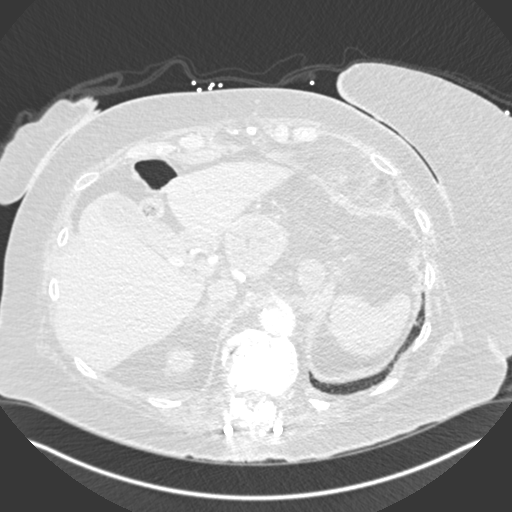
[im 77/295  soft-tissue]
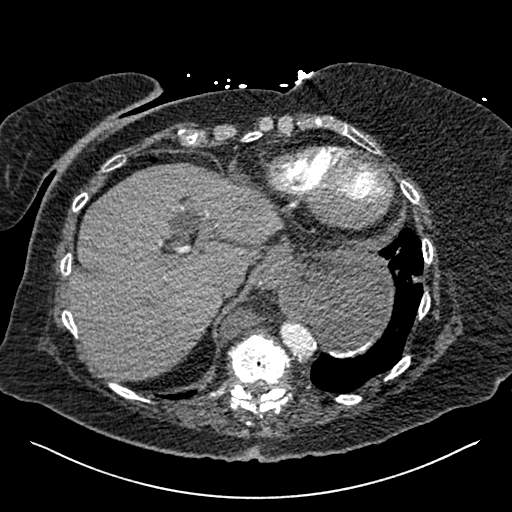
[im 90/295  lung]
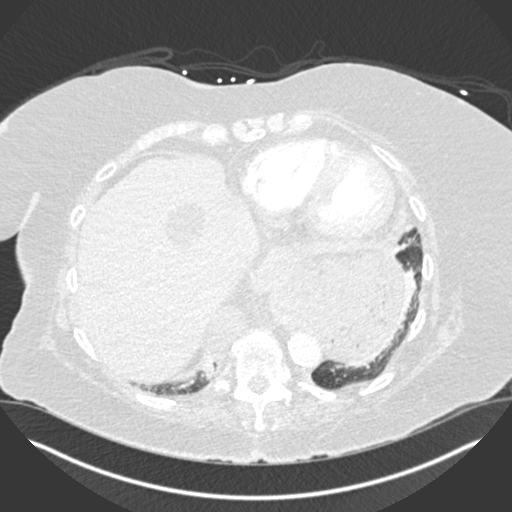
[im 116/295  soft-tissue]
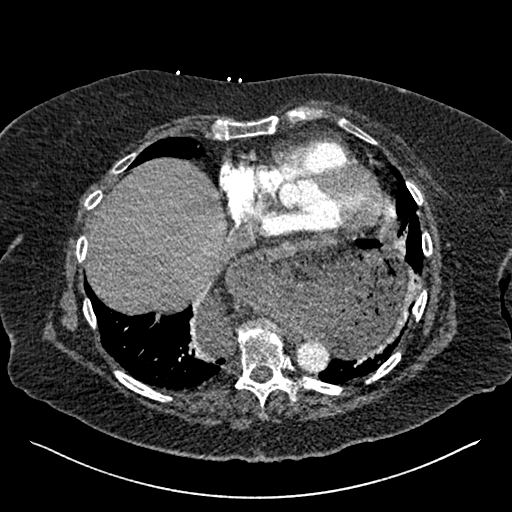
[im 128/295  lung]
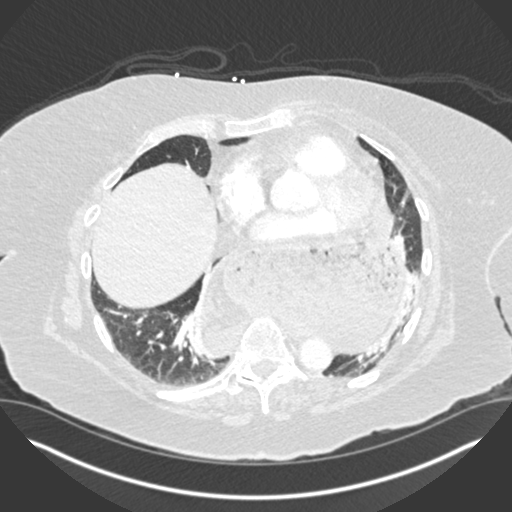
[im 154/295  soft-tissue]
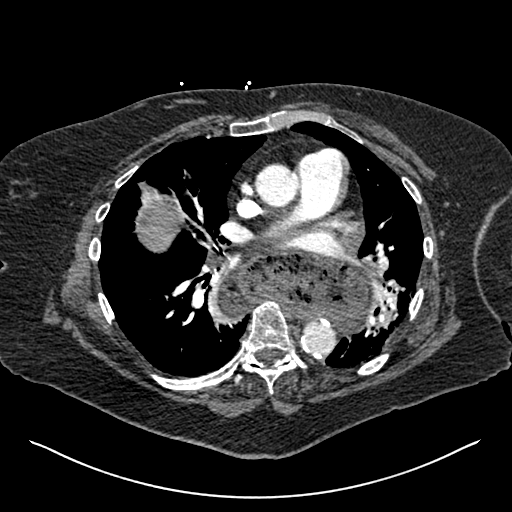
[im 167/295  lung]
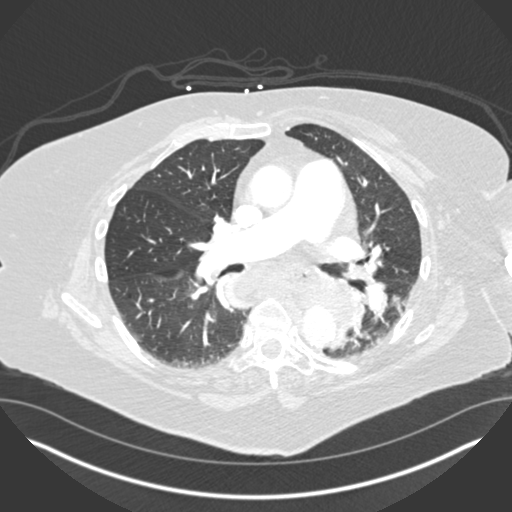
[im 179/295  soft-tissue]
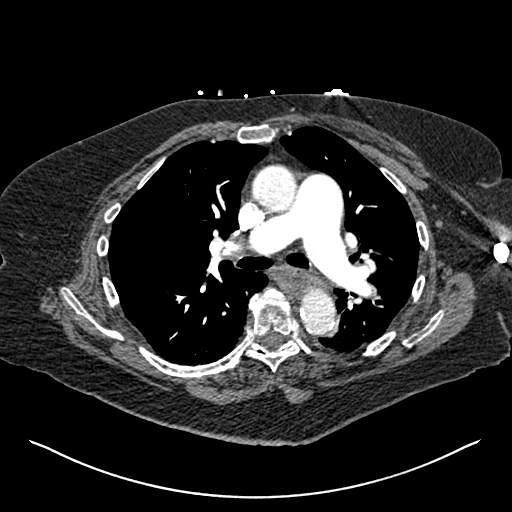
[im 205/295  lung]
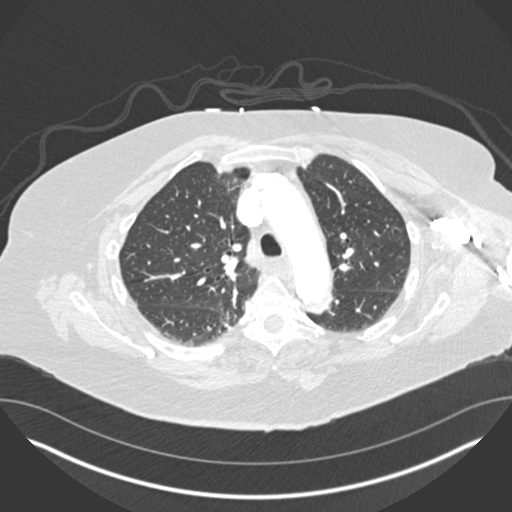
[im 218/295  soft-tissue]
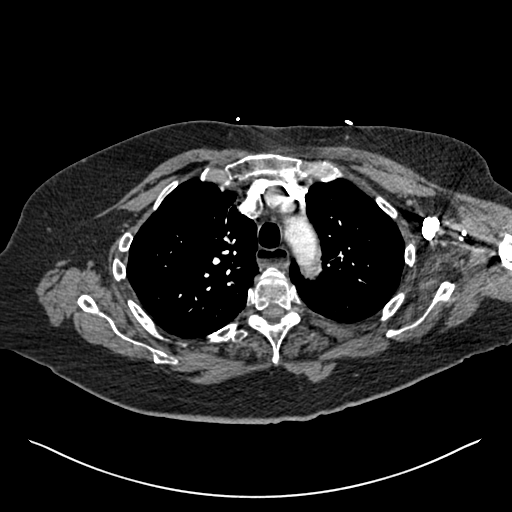
[im 243/295  lung]
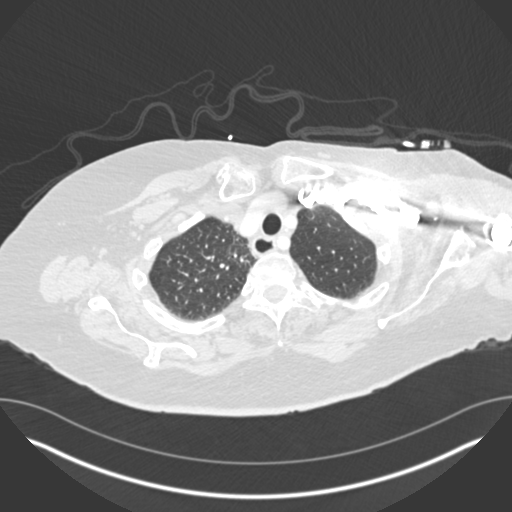
[im 256/295  soft-tissue]
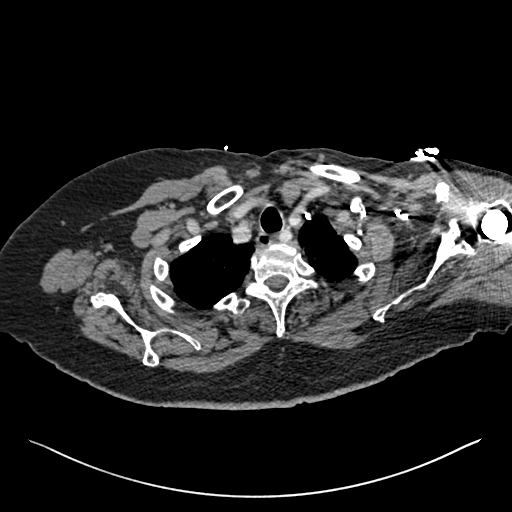
[im 282/295  lung]
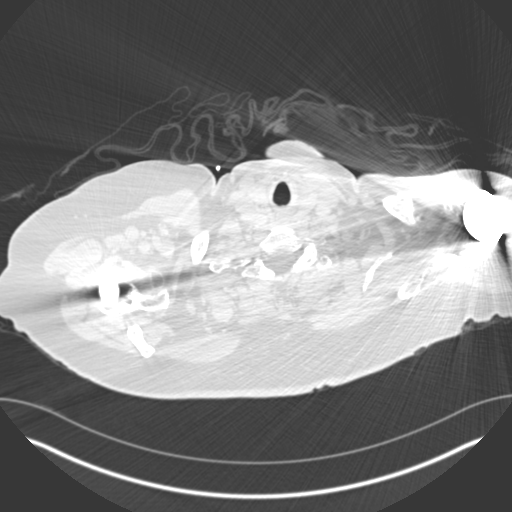

[Series 7: coronal mpr · coronal · 0.64mm/px · 3 of 102 slices shown]
[im 26/102  soft-tissue]
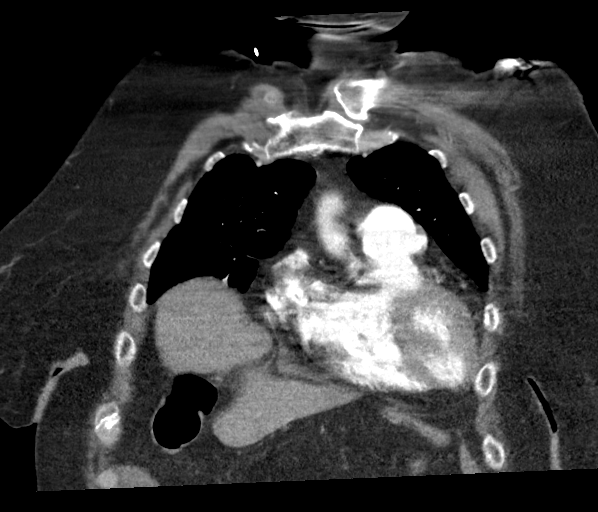
[im 51/102  soft-tissue]
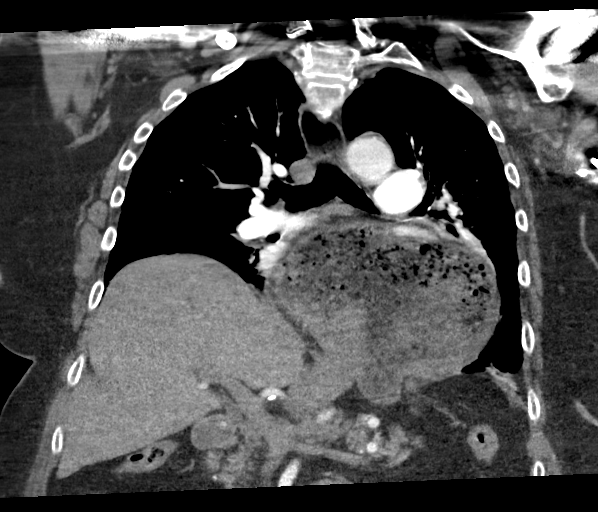
[im 76/102  soft-tissue]
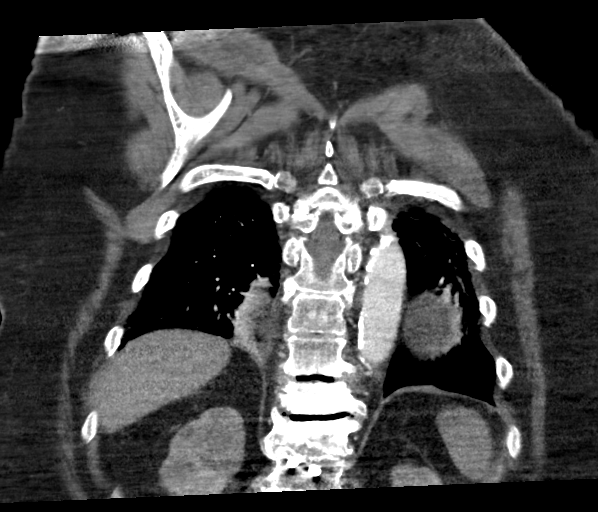

[18 of 46 positions shown; findings below may reference images not displayed]

FINDINGS: Cardiovascular: There is moderate to marked severity calcification
of the aortic arch. Satisfactory opacification of the pulmonary
arteries to the segmental level. No evidence of pulmonary embolism.
Normal heart size. No pericardial effusion.

Mediastinum/Nodes: No enlarged mediastinal, hilar, or axillary lymph
nodes. Thyroid gland, trachea, and esophagus demonstrate no
significant findings.

Lungs/Pleura: Mild atelectasis is seen within the bilateral lung
bases.

There is no evidence of a pleural effusion or pneumothorax.

Upper Abdomen: There is a very large gastric hernia. A mild amount
of perigastric fluid is seen within this area of herniation on the
right.

A 3.7 cm x 2.9 cm well-defined area of low attenuation is seen
within the anteromedial aspect of the right lobe of the liver.

Subcentimeter gallstones are seen within the lumen of an otherwise
normal-appearing gallbladder.

There is a 2.6 cm x 2.1 cm low-attenuation left adrenal mass.

Musculoskeletal: Bilateral metallic density shoulder replacements
are seen.

Multilevel degenerative changes are seen throughout the thoracic
spine. Bilateral pedicle screws are seen within the visualized
portion of the upper lumbar spine.

Review of the MIP images confirms the above findings.
IMPRESSION: 1. No evidence of pulmonary embolism.
2. Very large gastric hernia.
3. Cholelithiasis.
4. 3.7 cm x 2.9 cm well-defined area of low attenuation within the
anteromedial aspect of the right lobe of the liver. This may
represent a cyst or hemangioma. Correlation with hepatic ultrasound
is recommended.
5. 2.6 cm x 2.1 cm low-attenuation left adrenal mass which may
represent an adrenal adenoma.
6. Aortic atherosclerosis.

Aortic Atherosclerosis ([id number]).

## 2022-08-19 ENCOUNTER — Other Ambulatory Visit: Payer: Self-pay | Admitting: Internal Medicine

## 2022-08-19 DIAGNOSIS — G44319 Acute post-traumatic headache, not intractable: Secondary | ICD-10-CM

## 2022-09-01 ENCOUNTER — Ambulatory Visit
Admission: RE | Admit: 2022-09-01 | Discharge: 2022-09-01 | Disposition: A | Discharge status: Home / Self Care | Payer: Medicare PPO | Source: Ambulatory Visit | Attending: Internal Medicine | Admitting: Internal Medicine

## 2022-09-01 DIAGNOSIS — G44319 Acute post-traumatic headache, not intractable: Secondary | ICD-10-CM | POA: Diagnosis not present

## 2022-12-27 ENCOUNTER — Other Ambulatory Visit (HOSPITAL_COMMUNITY): Payer: Self-pay

## 2024-01-24 ENCOUNTER — Inpatient Hospital Stay
Admission: EM | Admit: 2024-01-24 | Discharge: 2024-02-03 | DRG: 871 | Disposition: A | Discharge status: Home Health | Attending: Internal Medicine | Admitting: Internal Medicine

## 2024-01-24 DIAGNOSIS — Z881 Allergy status to other antibiotic agents status: Secondary | ICD-10-CM

## 2024-01-24 DIAGNOSIS — Z7951 Long term (current) use of inhaled steroids: Secondary | ICD-10-CM

## 2024-01-24 DIAGNOSIS — N202 Calculus of kidney with calculus of ureter: Secondary | ICD-10-CM | POA: Diagnosis present

## 2024-01-24 DIAGNOSIS — K219 Gastro-esophageal reflux disease without esophagitis: Secondary | ICD-10-CM | POA: Diagnosis present

## 2024-01-24 DIAGNOSIS — Z91199 Patient's noncompliance with other medical treatment and regimen due to unspecified reason: Secondary | ICD-10-CM

## 2024-01-24 DIAGNOSIS — Z79899 Other long term (current) drug therapy: Secondary | ICD-10-CM

## 2024-01-24 DIAGNOSIS — Z90710 Acquired absence of both cervix and uterus: Secondary | ICD-10-CM

## 2024-01-24 DIAGNOSIS — Z96611 Presence of right artificial shoulder joint: Secondary | ICD-10-CM | POA: Diagnosis present

## 2024-01-24 DIAGNOSIS — Z882 Allergy status to sulfonamides status: Secondary | ICD-10-CM

## 2024-01-24 DIAGNOSIS — J9601 Acute respiratory failure with hypoxia: Principal | ICD-10-CM | POA: Diagnosis present

## 2024-01-24 DIAGNOSIS — A4189 Other specified sepsis: Principal | ICD-10-CM | POA: Diagnosis present

## 2024-01-24 DIAGNOSIS — Z7985 Long-term (current) use of injectable non-insulin antidiabetic drugs: Secondary | ICD-10-CM

## 2024-01-24 DIAGNOSIS — J189 Pneumonia, unspecified organism: Secondary | ICD-10-CM

## 2024-01-24 DIAGNOSIS — G4733 Obstructive sleep apnea (adult) (pediatric): Secondary | ICD-10-CM | POA: Diagnosis present

## 2024-01-24 DIAGNOSIS — Z87442 Personal history of urinary calculi: Secondary | ICD-10-CM

## 2024-01-24 DIAGNOSIS — F32A Depression, unspecified: Secondary | ICD-10-CM | POA: Diagnosis present

## 2024-01-24 DIAGNOSIS — Z981 Arthrodesis status: Secondary | ICD-10-CM

## 2024-01-24 DIAGNOSIS — F419 Anxiety disorder, unspecified: Secondary | ICD-10-CM | POA: Diagnosis present

## 2024-01-24 DIAGNOSIS — Z888 Allergy status to other drugs, medicaments and biological substances status: Secondary | ICD-10-CM

## 2024-01-24 DIAGNOSIS — Z96612 Presence of left artificial shoulder joint: Secondary | ICD-10-CM | POA: Diagnosis present

## 2024-01-24 DIAGNOSIS — J9621 Acute and chronic respiratory failure with hypoxia: Secondary | ICD-10-CM | POA: Diagnosis present

## 2024-01-24 DIAGNOSIS — G8929 Other chronic pain: Secondary | ICD-10-CM | POA: Diagnosis present

## 2024-01-24 DIAGNOSIS — Z87891 Personal history of nicotine dependence: Secondary | ICD-10-CM

## 2024-01-24 DIAGNOSIS — I10 Essential (primary) hypertension: Secondary | ICD-10-CM | POA: Diagnosis present

## 2024-01-24 DIAGNOSIS — N200 Calculus of kidney: Secondary | ICD-10-CM | POA: Diagnosis not present

## 2024-01-24 DIAGNOSIS — E785 Hyperlipidemia, unspecified: Secondary | ICD-10-CM | POA: Diagnosis present

## 2024-01-24 DIAGNOSIS — I4510 Unspecified right bundle-branch block: Secondary | ICD-10-CM | POA: Diagnosis present

## 2024-01-24 DIAGNOSIS — E66812 Obesity, class 2: Secondary | ICD-10-CM | POA: Insufficient documentation

## 2024-01-24 DIAGNOSIS — Z6838 Body mass index (BMI) 38.0-38.9, adult: Secondary | ICD-10-CM

## 2024-01-24 DIAGNOSIS — J129 Viral pneumonia, unspecified: Secondary | ICD-10-CM | POA: Diagnosis present

## 2024-01-24 DIAGNOSIS — Z885 Allergy status to narcotic agent status: Secondary | ICD-10-CM

## 2024-01-24 DIAGNOSIS — M545 Low back pain, unspecified: Secondary | ICD-10-CM | POA: Diagnosis present

## 2024-01-24 DIAGNOSIS — Z886 Allergy status to analgesic agent status: Secondary | ICD-10-CM

## 2024-01-24 DIAGNOSIS — A419 Sepsis, unspecified organism: Secondary | ICD-10-CM | POA: Diagnosis present

## 2024-01-24 DIAGNOSIS — R112 Nausea with vomiting, unspecified: Secondary | ICD-10-CM

## 2024-01-24 DIAGNOSIS — Z833 Family history of diabetes mellitus: Secondary | ICD-10-CM

## 2024-01-24 DIAGNOSIS — E876 Hypokalemia: Secondary | ICD-10-CM | POA: Diagnosis present

## 2024-01-24 DIAGNOSIS — J209 Acute bronchitis, unspecified: Secondary | ICD-10-CM | POA: Diagnosis not present

## 2024-01-24 DIAGNOSIS — K5900 Constipation, unspecified: Secondary | ICD-10-CM | POA: Diagnosis not present

## 2024-01-25 ENCOUNTER — Emergency Department

## 2024-01-25 ENCOUNTER — Other Ambulatory Visit: Payer: Self-pay

## 2024-01-25 DIAGNOSIS — F419 Anxiety disorder, unspecified: Secondary | ICD-10-CM | POA: Diagnosis present

## 2024-01-25 DIAGNOSIS — E785 Hyperlipidemia, unspecified: Secondary | ICD-10-CM | POA: Insufficient documentation

## 2024-01-25 DIAGNOSIS — Z96611 Presence of right artificial shoulder joint: Secondary | ICD-10-CM | POA: Diagnosis present

## 2024-01-25 DIAGNOSIS — Z7985 Long-term (current) use of injectable non-insulin antidiabetic drugs: Secondary | ICD-10-CM | POA: Diagnosis not present

## 2024-01-25 DIAGNOSIS — K5900 Constipation, unspecified: Secondary | ICD-10-CM | POA: Diagnosis not present

## 2024-01-25 DIAGNOSIS — Z87891 Personal history of nicotine dependence: Secondary | ICD-10-CM | POA: Diagnosis not present

## 2024-01-25 DIAGNOSIS — F32A Depression, unspecified: Secondary | ICD-10-CM | POA: Diagnosis present

## 2024-01-25 DIAGNOSIS — E66812 Obesity, class 2: Secondary | ICD-10-CM | POA: Diagnosis present

## 2024-01-25 DIAGNOSIS — G4733 Obstructive sleep apnea (adult) (pediatric): Secondary | ICD-10-CM | POA: Diagnosis present

## 2024-01-25 DIAGNOSIS — I10 Essential (primary) hypertension: Secondary | ICD-10-CM

## 2024-01-25 DIAGNOSIS — I4510 Unspecified right bundle-branch block: Secondary | ICD-10-CM | POA: Diagnosis present

## 2024-01-25 DIAGNOSIS — Z981 Arthrodesis status: Secondary | ICD-10-CM | POA: Diagnosis not present

## 2024-01-25 DIAGNOSIS — J189 Pneumonia, unspecified organism: Secondary | ICD-10-CM | POA: Diagnosis not present

## 2024-01-25 DIAGNOSIS — Z96612 Presence of left artificial shoulder joint: Secondary | ICD-10-CM | POA: Diagnosis present

## 2024-01-25 DIAGNOSIS — Z90710 Acquired absence of both cervix and uterus: Secondary | ICD-10-CM | POA: Diagnosis not present

## 2024-01-25 DIAGNOSIS — Z87442 Personal history of urinary calculi: Secondary | ICD-10-CM | POA: Diagnosis not present

## 2024-01-25 DIAGNOSIS — A4189 Other specified sepsis: Secondary | ICD-10-CM | POA: Diagnosis present

## 2024-01-25 DIAGNOSIS — Z833 Family history of diabetes mellitus: Secondary | ICD-10-CM | POA: Diagnosis not present

## 2024-01-25 DIAGNOSIS — E876 Hypokalemia: Secondary | ICD-10-CM | POA: Diagnosis present

## 2024-01-25 DIAGNOSIS — J209 Acute bronchitis, unspecified: Secondary | ICD-10-CM | POA: Diagnosis not present

## 2024-01-25 DIAGNOSIS — K219 Gastro-esophageal reflux disease without esophagitis: Secondary | ICD-10-CM | POA: Diagnosis present

## 2024-01-25 DIAGNOSIS — A419 Sepsis, unspecified organism: Secondary | ICD-10-CM | POA: Diagnosis present

## 2024-01-25 DIAGNOSIS — J129 Viral pneumonia, unspecified: Secondary | ICD-10-CM | POA: Diagnosis present

## 2024-01-25 DIAGNOSIS — J9601 Acute respiratory failure with hypoxia: Secondary | ICD-10-CM | POA: Diagnosis present

## 2024-01-25 DIAGNOSIS — Z6838 Body mass index (BMI) 38.0-38.9, adult: Secondary | ICD-10-CM | POA: Diagnosis not present

## 2024-01-25 DIAGNOSIS — N200 Calculus of kidney: Secondary | ICD-10-CM | POA: Diagnosis present

## 2024-01-25 DIAGNOSIS — N202 Calculus of kidney with calculus of ureter: Secondary | ICD-10-CM | POA: Diagnosis present

## 2024-01-25 DIAGNOSIS — J9621 Acute and chronic respiratory failure with hypoxia: Secondary | ICD-10-CM | POA: Diagnosis present

## 2024-01-25 LAB — CBC WITH DIFFERENTIAL/PLATELET
Abs Immature Granulocytes: 0.08 10*3/uL — ABNORMAL HIGH (ref 0.00–0.07)
Basophils Absolute: 0.1 10*3/uL (ref 0.0–0.1)
Basophils Relative: 1 %
Eosinophils Absolute: 0.1 10*3/uL (ref 0.0–0.5)
Eosinophils Relative: 1 %
HCT: 47.3 % — ABNORMAL HIGH (ref 36.0–46.0)
Hemoglobin: 16 g/dL — ABNORMAL HIGH (ref 12.0–15.0)
Immature Granulocytes: 1 %
Lymphocytes Relative: 30 %
Lymphs Abs: 3 10*3/uL (ref 0.7–4.0)
MCH: 31.9 pg (ref 26.0–34.0)
MCHC: 33.8 g/dL (ref 30.0–36.0)
MCV: 94.2 fL (ref 80.0–100.0)
Monocytes Absolute: 0.7 10*3/uL (ref 0.1–1.0)
Monocytes Relative: 7 %
Neutro Abs: 6 10*3/uL (ref 1.7–7.7)
Neutrophils Relative %: 60 %
Platelets: 231 10*3/uL (ref 150–400)
RBC: 5.02 MIL/uL (ref 3.87–5.11)
RDW: 13.4 % (ref 11.5–15.5)
WBC: 9.8 10*3/uL (ref 4.0–10.5)
nRBC: 0 % (ref 0.0–0.2)

## 2024-01-25 LAB — COMPREHENSIVE METABOLIC PANEL WITH GFR
ALT: 13 U/L (ref 0–44)
AST: 24 U/L (ref 15–41)
Albumin: 3.5 g/dL (ref 3.5–5.0)
Alkaline Phosphatase: 71 U/L (ref 38–126)
Anion gap: 12 (ref 5–15)
BUN: 19 mg/dL (ref 8–23)
CO2: 24 mmol/L (ref 22–32)
Calcium: 10.2 mg/dL (ref 8.9–10.3)
Chloride: 105 mmol/L (ref 98–111)
Creatinine, Ser: 0.65 mg/dL (ref 0.44–1.00)
GFR, Estimated: >60 mL/min (ref >60)
Glucose, Bld: 136 mg/dL — ABNORMAL HIGH (ref 70–99)
Potassium: 3.2 mmol/L — ABNORMAL LOW (ref 3.5–5.1)
Sodium: 141 mmol/L (ref 135–145)
Total Bilirubin: 1.3 mg/dL — ABNORMAL HIGH (ref 0.0–1.2)
Total Protein: 6.3 g/dL — ABNORMAL LOW (ref 6.5–8.1)

## 2024-01-25 LAB — PROTIME-INR
INR: 1 (ref 0.8–1.2)
INR: 1 (ref 0.8–1.2)
Prothrombin Time: 13.6 s (ref 11.4–15.2)
Prothrombin Time: 13.7 s (ref 11.4–15.2)

## 2024-01-25 LAB — URINALYSIS, W/ REFLEX TO CULTURE (INFECTION SUSPECTED)
Bacteria, UA: NONE SEEN
Bilirubin Urine: NEGATIVE
Glucose, UA: NEGATIVE mg/dL
Hgb urine dipstick: NEGATIVE
Ketones, ur: NEGATIVE mg/dL
Leukocytes,Ua: NEGATIVE
Nitrite: NEGATIVE
Protein, ur: NEGATIVE mg/dL
Specific Gravity, Urine: 1.015 (ref 1.005–1.030)
pH: 6 (ref 5.0–8.0)

## 2024-01-25 LAB — BASIC METABOLIC PANEL WITH GFR
Anion gap: 10 (ref 5–15)
BUN: 17 mg/dL (ref 8–23)
CO2: 25 mmol/L (ref 22–32)
Calcium: 9.6 mg/dL (ref 8.9–10.3)
Chloride: 104 mmol/L (ref 98–111)
Creatinine, Ser: 0.51 mg/dL (ref 0.44–1.00)
GFR, Estimated: >60 mL/min (ref >60)
Glucose, Bld: 104 mg/dL — ABNORMAL HIGH (ref 70–99)
Potassium: 4 mmol/L (ref 3.5–5.1)
Sodium: 139 mmol/L (ref 135–145)

## 2024-01-25 LAB — BLOOD GAS, VENOUS
Acid-Base Excess: 4.2 mmol/L — ABNORMAL HIGH (ref 0.0–2.0)
Bicarbonate: 30.3 mmol/L — ABNORMAL HIGH (ref 20.0–28.0)
O2 Saturation: 67 %
Patient temperature: 37
pCO2, Ven: 50 mmHg (ref 44–60)
pH, Ven: 7.39 (ref 7.25–7.43)
pO2, Ven: 37 mmHg (ref 32–45)

## 2024-01-25 LAB — LACTIC ACID, PLASMA
Lactic Acid, Venous: 2.5 mmol/L (ref 0.5–1.9)
Lactic Acid, Venous: 3.7 mmol/L (ref 0.5–1.9)
Lactic Acid, Venous: 3 mmol/L (ref 0.5–1.9)
Lactic Acid, Venous: 2.4 mmol/L (ref 0.5–1.9)

## 2024-01-25 LAB — RESP PANEL BY RT-PCR (RSV, FLU A&B, COVID)  RVPGX2
Influenza A by PCR: NEGATIVE
Influenza B by PCR: NEGATIVE
Resp Syncytial Virus by PCR: NEGATIVE
SARS Coronavirus 2 by RT PCR: NEGATIVE

## 2024-01-25 LAB — TROPONIN I (HIGH SENSITIVITY): Troponin I (High Sensitivity): 7 ng/L (ref <18)

## 2024-01-25 LAB — CORTISOL-AM, BLOOD: Cortisol - AM: 17.6 ug/dL (ref 6.7–22.6)

## 2024-01-25 LAB — PROCALCITONIN: Procalcitonin: <0.1 ng/mL

## 2024-01-25 MED ORDER — PANTOPRAZOLE SODIUM 40 MG PO TBEC
40.0000 mg | DELAYED_RELEASE_TABLET | Freq: Every day | ORAL | Status: DC
  Administered 2024-01-26 – 2024-02-03 (×9): 40 mg via ORAL
  Filled 2024-01-25 (×9): qty 1

## 2024-01-25 MED ORDER — LACTATED RINGERS IV BOLUS
1000.0000 mL | Freq: Once | INTRAVENOUS | Status: AC
  Administered 2024-01-25: 1000 mL via INTRAVENOUS

## 2024-01-25 MED ORDER — ONDANSETRON HCL 4 MG PO TABS: 4.0000 mg | ORAL_TABLET | Freq: Four times a day (QID) | ORAL | Status: DC | PRN

## 2024-01-25 MED ORDER — IOHEXOL 300 MG/ML  SOLN
100.0000 mL | Freq: Once | INTRAMUSCULAR | Status: AC | PRN
Start: 2024-01-25 — End: 2024-01-25
  Administered 2024-01-25: 100 mL via INTRAVENOUS

## 2024-01-25 MED ORDER — VANCOMYCIN HCL IN DEXTROSE 1-5 GM/200ML-% IV SOLN
1000.0000 mg | Freq: Once | INTRAVENOUS | Status: AC
  Administered 2024-01-25: 1000 mg via INTRAVENOUS
  Filled 2024-01-25: qty 200

## 2024-01-25 MED ORDER — LACTATED RINGERS IV SOLN
150.0000 mL/h | INTRAVENOUS | Status: DC
  Administered 2024-01-25: 150 mL/h via INTRAVENOUS

## 2024-01-25 MED ORDER — ENOXAPARIN SODIUM 40 MG/0.4ML IJ SOSY
40.0000 mg | PREFILLED_SYRINGE | INTRAMUSCULAR | Status: DC
  Administered 2024-01-25 – 2024-01-27 (×3): 40 mg via SUBCUTANEOUS
  Filled 2024-01-25 (×3): qty 0.4

## 2024-01-25 MED ORDER — METRONIDAZOLE 500 MG/100ML IV SOLN
500.0000 mg | Freq: Once | INTRAVENOUS | Status: AC
  Administered 2024-01-25: 500 mg via INTRAVENOUS
  Filled 2024-01-25: qty 100

## 2024-01-25 MED ORDER — TRAZODONE HCL 50 MG PO TABS: 25.0000 mg | ORAL_TABLET | Freq: Every evening | ORAL | Status: DC | PRN

## 2024-01-25 MED ORDER — HYDROCOD POLI-CHLORPHE POLI ER 10-8 MG/5ML PO SUER: 5.0000 mL | Freq: Two times a day (BID) | ORAL | Status: DC | PRN

## 2024-01-25 MED ORDER — ACETAMINOPHEN 650 MG RE SUPP: 650.0000 mg | Freq: Four times a day (QID) | RECTAL | Status: DC | PRN

## 2024-01-25 MED ORDER — LACTATED RINGERS IV SOLN: INTRAVENOUS | Status: DC

## 2024-01-25 MED ORDER — ACETAMINOPHEN 325 MG PO TABS
650.0000 mg | ORAL_TABLET | Freq: Four times a day (QID) | ORAL | Status: DC | PRN
  Administered 2024-01-25 – 2024-02-02 (×10): 650 mg via ORAL
  Filled 2024-01-25 (×10): qty 2

## 2024-01-25 MED ORDER — POTASSIUM CHLORIDE 10 MEQ/100ML IV SOLN
10.0000 meq | INTRAVENOUS | Status: AC
  Administered 2024-01-25 (×2): 10 meq via INTRAVENOUS
  Filled 2024-01-25 (×2): qty 100

## 2024-01-25 MED ORDER — ONDANSETRON HCL 4 MG/2ML IJ SOLN
4.0000 mg | Freq: Once | INTRAMUSCULAR | Status: AC
  Administered 2024-01-25: 4 mg via INTRAVENOUS
  Filled 2024-01-25: qty 2

## 2024-01-25 MED ORDER — ONDANSETRON HCL 4 MG/2ML IJ SOLN: 4.0000 mg | Freq: Four times a day (QID) | INTRAMUSCULAR | Status: DC | PRN

## 2024-01-25 MED ORDER — LACTATED RINGERS IV BOLUS (SEPSIS)
1000.0000 mL | Freq: Once | INTRAVENOUS | Status: AC
  Administered 2024-01-25: 1000 mL via INTRAVENOUS

## 2024-01-25 MED ORDER — IPRATROPIUM-ALBUTEROL 0.5-2.5 (3) MG/3ML IN SOLN
3.0000 mL | Freq: Four times a day (QID) | RESPIRATORY_TRACT | Status: DC
  Administered 2024-01-25 – 2024-01-28 (×12): 3 mL via RESPIRATORY_TRACT
  Filled 2024-01-25 (×12): qty 3

## 2024-01-25 MED ORDER — SODIUM CHLORIDE 0.9 % IV SOLN
2.0000 g | INTRAVENOUS | Status: AC
  Administered 2024-01-25 – 2024-01-29 (×5): 2 g via INTRAVENOUS
  Filled 2024-01-25 (×5): qty 20

## 2024-01-25 MED ORDER — MAGNESIUM HYDROXIDE 400 MG/5ML PO SUSP: 30.0000 mL | Freq: Every day | ORAL | Status: DC | PRN

## 2024-01-25 MED ORDER — SODIUM CHLORIDE 0.9 % IV SOLN
500.0000 mg | INTRAVENOUS | Status: AC
  Administered 2024-01-25 – 2024-01-29 (×5): 500 mg via INTRAVENOUS
  Filled 2024-01-25 (×5): qty 5

## 2024-01-25 MED ORDER — CEFEPIME HCL 2 G IV SOLR
2.0000 g | Freq: Once | INTRAVENOUS | Status: AC
  Administered 2024-01-25: 2 g via INTRAVENOUS
  Filled 2024-01-25: qty 12.5

## 2024-01-25 MED ORDER — GUAIFENESIN ER 600 MG PO TB12
600.0000 mg | ORAL_TABLET | Freq: Two times a day (BID) | ORAL | Status: DC
  Administered 2024-01-25 – 2024-02-03 (×19): 600 mg via ORAL
  Filled 2024-01-25 (×19): qty 1

## 2024-01-25 NOTE — Assessment & Plan Note (Signed)
 -  Continue antihypertensive therapy ?

## 2024-01-25 NOTE — TOC Initial Note (Signed)
 Transition of Care [facility]) - Initial/Assessment Note    Patient Details  Name: Jessica Leonard MRN: [id number] Date of Birth: 06-04-1941  Transition of [facility]) CM/SW Contact:    Elsie Halo, RN Phone Number: 01/25/2024, 12:53 PM  Clinical Narrative:                 [facility] visited the patient in her room. Her daughter Jessica Leonard [phone] was also in the room. The patient is from home with a live in caregiver, Judie Noun. Judie Noun drives her to appointments and to run errands. Her PCP is Dr. Clearnce Curia and she uses [facility] on [address]. She has DME: walker, cane, scooter, rollator, electric chair, and shower chair. Currently, the patient has a new O2 requirement.  TOC will continue to follow for dc planning.    Barriers to Discharge: Continued Medical Work up   Patient Goals and CMS Choice            Expected Discharge Plan and Services   Discharge Planning Services: CM Consult   Living arrangements for the past 2 months: Single Family Home                                      Prior Living Arrangements/Services Living arrangements for the past 2 months: Single Family Home Lives with:: Other (Comment) (she has a live in Social worker, IT trainer who stays with her)                   Activities of Daily Living      Permission Sought/Granted                  Emotional Assessment   Attitude/Demeanor/Rapport: Engaged Affect (typically observed): Appropriate Orientation: : Oriented to Self, Oriented to Place, Oriented to  Time, Oriented to Situation   Psych Involvement: No (comment)  Admission diagnosis:  Sepsis due to pneumonia (HCC) [J18.9, A41.9] Patient Active Problem List   Diagnosis Date Noted   Sepsis due to pneumonia (HCC) 01/25/2024   Dyslipidemia 01/25/2024   Anxiety and depression 01/25/2024   GERD without esophagitis 01/25/2024   Hx of colonic polyps    Rash 07/16/2020   Coronary artery disease involving native coronary artery of native  heart 06/25/2020   Diaphragmatic hernia without obstruction or gangrene 06/05/2020   Nausea and vomiting    HLD (hyperlipidemia) 01/14/2020   Essential hypertension    GERD (gastroesophageal reflux disease)    Depression    Hiatal hernia    Acute respiratory failure with hypoxia (HCC)    Obesity, morbid (HCC) 12/21/2018   Primary osteoarthritis of both knees 12/21/2018   Personal history of other malignant neoplasm of skin 10/04/2017   Status post replacement of left shoulder joint 12/09/2015   Complete rupture of rotator cuff 04/23/2014   Primary localized osteoarthrosis of shoulder region 05/01/2013   PCP:  Rex Castor, MD Pharmacy:   [facility] [address] [place] - [address] [place] [address] Phone: [phone] Fax: [phone]     Social Drivers of Health (SDOH) Social History: SDOH Screenings   Food Insecurity: No Food Insecurity (01/19/2024)   Received from [facility]  Housing: Low Risk  (01/19/2024)   Received from [facility]  Transportation Needs: No Transportation Needs (01/19/2024)   Received from [facility]  Utilities: Not At Risk (01/19/2024)  Received [facility]  Financial Resource Strain: Low Risk  (01/19/2024)   Received from [facility]  Tobacco Use: Medium Risk (01/25/2024)   SDOH Interventions:     Readmission Risk Interventions     No data to display

## 2024-01-25 NOTE — Progress Notes (Signed)
 CODE SEPSIS - PHARMACY COMMUNICATION  **Broad Spectrum Antibiotics should be administered within 1 hour of Sepsis diagnosis**  Time Code Sepsis Called/Page Received: 6/18 @ 0025   Antibiotics Ordered: Cefepime, Vancomycin  Time of 1st antibiotic administration: Cefepime 2 gm IV X 1 on 6/18 @ 0044   Additional action taken by pharmacy:   If necessary, Name of Provider/Nurse Contacted:     Schwanda Zima D ,PharmD Clinical Pharmacist  01/25/2024  12:46 AM

## 2024-01-25 NOTE — Assessment & Plan Note (Signed)
 Will continue statin therapy

## 2024-01-25 NOTE — ED Notes (Signed)
 Pt is a high fall risk. Pt has fall risk bracelet, non skid socks, and bed alarm is on. Fall bundle is in pace at this time.

## 2024-01-25 NOTE — Assessment & Plan Note (Signed)
-   Will continue Cymbalta  and Tofranil

## 2024-01-25 NOTE — Sepsis Progress Note (Signed)
 Elink monitoring for the code sepsis protocol.

## 2024-01-25 NOTE — ED Notes (Signed)
 Lactic sent at this time. Difficulty with both IV's noted with one in the left hand being taken out. Able to obtain sample from IV in right forearm with difficulty.

## 2024-01-25 NOTE — H&P (Signed)
 Jessica Leonard   PATIENT NAME: Jessica Leonard    MR#:  [id number]  DATE OF BIRTH:  1941-03-04  DATE OF ADMISSION:  01/24/2024  PRIMARY CARE PHYSICIAN: Rex Castor, MD   Patient is coming from: Home  REQUESTING/REFERRING PHYSICIAN: Ward, Clover Dao, DO  CHIEF COMPLAINT:  Shortness of breath and cough  HISTORY OF PRESENT ILLNESS:  Jessica Leonard is a 83 y.o. female with medical history significant for anxiety, depression, chronic back pain, essential hypertension, and dyslipidemia who presented to the emergency room, presented to the emergency room with a calcium  of worsening dyspnea with associated cough and wheezing over the last week.  The patient admitted to chills but denies any measured fever.  No nausea or vomiting or abdominal pain.  No dysuria, oliguria or hematuria or flank pain.  No chest pain or palpitations.  She was found to be hypoxic by EMS with a pulse oximetry of 75% on room air.  She does not wear oxygen at home.  She was seen by her PCP on 6/12 and diagnosed with bronchitis.  She was placed on doxycycline and prednisone.  She was initially in the ER on nonrebreather.  ED Course: When she came to the ER, BP was 175/96 with temperature 97.4 axillary and respiratory to 25 and pulse oximetry 75% on room air and 94% on high flow nasal cannula 11L/min.  Heart rate later on was 92.  Labs revealed hypokalemia 3.2 blood glucose 136 with total protein of 6.3 and total bili of 1.3.  Lactic acid was 2.5 and procalcitonin less than 0.1.  High-sensitivity troponin was 7.  CBC showed hemoconcentration.  INR is 1 and PT 13.7.  UA showed 6-10 WBCs with no bacteria and negative nitrite. EKG as reviewed by me : EKG showed sinus rhythm with rate of 87 with left atrial enlargement, right bundle branch block and left intrafascicular block. Imaging: Forward chest x-ray showed low lung volumes and mild left basal atelectasis.  Chest abdomen pelvic CT scan with contrast revealed the  following: 1. Mild posterior right lower lobe opacity, favoring mild infection/pneumonia over atelectasis/scarring. 2. 4 mm calculus in the left proximal collecting system/ureter. No hydronephrosis. 3. Additional ancillary findings as above.  The patient was given IV Rocephin and Zithromax as well as 2 L bolus of IV lactated Ringer, 4 mg of IV Zofran  and 10 mg of IV potassium chloride . PAST MEDICAL HISTORY:   Past Medical History:  Diagnosis Date  . Anxiety   . Complication of anesthesia    hard time waking up after hysterectomy  . Depression   . Epigastric pain   . Hypertension   -Dyslipidemia - Chronic back pain  PAST SURGICAL HISTORY:   Past Surgical History:  Procedure Laterality Date  . ABDOMINAL HYSTERECTOMY    . APPENDECTOMY    . back fusion    . BACK SURGERY    . BUNIONECTOMY    . cataracts    . COLONOSCOPY WITH PROPOFOL  N/A 10/08/2020   Procedure: COLONOSCOPY WITH PROPOFOL ;  Surgeon: Selena Daily, MD;  Location: [facility] ENDOSCOPY;  Service: Gastroenterology;  Laterality: N/A;  . ESOPHAGOGASTRODUODENOSCOPY (EGD) WITH PROPOFOL  N/A 01/15/2020   Procedure: ESOPHAGOGASTRODUODENOSCOPY (EGD) WITH PROPOFOL ;  Surgeon: Selena Daily, MD;  Location: [facility] ENDOSCOPY;  Service: Gastroenterology;  Laterality: N/A;  . ESOPHAGOGASTRODUODENOSCOPY (EGD) WITH PROPOFOL  N/A 10/08/2020   Procedure: ESOPHAGOGASTRODUODENOSCOPY (EGD) WITH PROPOFOL ;  Surgeon: Selena Daily, MD;  Location: [facility] ENDOSCOPY;  Service: Gastroenterology;  Laterality: N/A;  .  EYE SURGERY    . INCONTINENCE SURGERY    . TOTAL SHOULDER ARTHROPLASTY Bilateral     SOCIAL HISTORY:   Social History   Tobacco Use  . Smoking status: Former    Current packs/day: 0.00    Types: Cigarettes    Quit date: 2011    Years since quitting: 14.4  . Smokeless tobacco: Never  Substance Use Topics  . Alcohol use: Not Currently    FAMILY HISTORY:   Family History  Problem Relation Age of Onset  . Diabetes  Mellitus II Father     DRUG ALLERGIES:   Allergies  Allergen Reactions  . Sulfa Antibiotics Hives and Rash    BLOOD-RED HIVES BLOOD-RED HIVES   . Amoxicillin Nausea And Vomiting  . Codeine Nausea Only and Nausea And Vomiting  . Clindamycin   . Nsaids Other (See Comments)    Long term use of ibuprofen. Doctor wanted her not to take IBP anymore.    . Other Rash    Paper tape only Paper tape only     REVIEW OF SYSTEMS:   ROS As per history of present illness. All pertinent systems were reviewed above. Constitutional, HEENT, cardiovascular, respiratory, GI, GU, musculoskeletal, neuro, psychiatric, endocrine, integumentary and hematologic systems were reviewed and are otherwise negative/unremarkable except for positive findings mentioned above in the HPI.   MEDICATIONS AT HOME:   Prior to Admission medications   Medication Sig Start Date End Date Taking? Authorizing Provider  acetaminophen  (TYLENOL ) 500 MG tablet Take 500 mg by mouth 2 (two) times daily.   Yes [provider]  atorvastatin  (LIPITOR) 20 MG tablet Take 20 mg by mouth at bedtime. 12/26/19  Yes [provider]  Calcium  Carb-Cholecalciferol 600-10 MG-MCG TABS Take 1 tablet by mouth 2 (two) times daily with a meal.   Yes [provider]  carboxymethylcellulose (REFRESH PLUS) 0.5 % SOLN Place 1-2 drops into both eyes as needed (for dry eyes).   Yes [provider]  Cholecalciferol 25 MCG (1000 UT) tablet Take 1,000 Units by mouth daily.   Yes [provider]  cyanocobalamin (VITAMIN B12) 500 MCG tablet Take 500 mcg by mouth daily.   Yes [provider]  doxycycline (VIBRAMYCIN) 100 MG capsule Take 100 mg by mouth 2 (two) times daily. for 7 days 01/19/24 01/26/24 Yes [provider]  DULoxetine  (CYMBALTA ) 60 MG capsule Take 60 mg by mouth daily. 12/26/19  Yes [provider]  ipratropium (ATROVENT) 0.02 % nebulizer solution Inhale 0.5 mg into the  lungs 3 (three) times daily. 01/19/24  Yes [provider]  Multiple Vitamins-Minerals (PRESERVISION AREDS 2) CAPS Take 1 capsule by mouth 2 (two) times daily.   Yes [provider]  NIFEdipine  (PROCARDIA  XL/NIFEDICAL XL) 60 MG 24 hr tablet Take 60 mg by mouth daily. 12/26/19  Yes [provider]  pantoprazole  (PROTONIX ) 40 MG tablet Take 1 tablet (40 mg total) by mouth 2 (two) times daily. 01/15/20  Yes Patel, Sona, MD  predniSONE (DELTASONE) 10 MG tablet as directed. 4,4,3,3,2,2,1,1 01/19/24  Yes [provider]  psyllium (METAMUCIL) 58.6 % packet Take 1 packet by mouth daily.   Yes [provider]  Semaglutide-Weight Management 1.7 MG/0.75ML SOAJ Inject 1.7 mg into the skin once a week. (Monday) 12/19/23  Yes [provider]  TRELEGY ELLIPTA 100-62.5-25 MCG/ACT AEPB Inhale 1 puff into the lungs daily. 01/19/24  Yes [provider]  imipramine  (TOFRANIL ) 25 MG tablet Take 25 mg by mouth at bedtime. Patient  not taking: Reported on 01/25/2024 12/31/19   [provider]      VITAL SIGNS:  Blood pressure (!) 157/74, pulse 77, temperature 97.9 F (36.6 C), temperature source Oral, resp. rate 20, weight 94.3 kg, SpO2 96%.  PHYSICAL EXAMINATION:  Physical Exam  GENERAL:  83 y.o.-year-old patient lying in the bed with mild respiratory distress with conversational dyspnea. EYES: Pupils equal, round, reactive to light and accommodation. No scleral icterus. Extraocular muscles intact.  HEENT: Head atraumatic, normocephalic. Oropharynx and nasopharynx clear.  NECK:  Supple, no jugular venous distention. No thyroid enlargement, no tenderness.  LUNGS: Diminished right basal breath sounds with right basal crackles.  No use of accessory muscles of respiration.  CARDIOVASCULAR: Regular rate and rhythm, S1, S2 normal. No murmurs, rubs, or gallops.  ABDOMEN: Soft, nondistended, nontender. Bowel sounds present. No organomegaly or mass.   EXTREMITIES: No pedal edema, cyanosis, or clubbing.  NEUROLOGIC: Cranial nerves II through XII are intact. Muscle strength 5/5 in all extremities. Sensation intact. Gait not checked.  PSYCHIATRIC: The patient is alert and oriented x 3.  Normal affect and good eye contact. SKIN: No obvious rash, lesion, or ulcer.   LABORATORY PANEL:   CBC Recent Labs  Lab 01/25/24 0014  WBC 9.8  HGB 16.0*  HCT 47.3*  PLT 231   ------------------------------------------------------------------------------------------------------------------  Chemistries  Recent Labs  Lab 01/25/24 0014 01/25/24 0418  NA 141 139  K 3.2* 4.0  CL 105 104  CO2 24 25  GLUCOSE 136* 104*  BUN 19 17  CREATININE 0.65 0.51  CALCIUM  10.2 9.6  AST 24  --   ALT 13  --   ALKPHOS 71  --   BILITOT 1.3*  --    ------------------------------------------------------------------------------------------------------------------  Cardiac Enzymes No results for input(s): TROPONINI in the last 168 hours. ------------------------------------------------------------------------------------------------------------------  RADIOLOGY:  CT CHEST ABDOMEN PELVIS W CONTRAST Result Date: 01/25/2024 EXAM: CT CHEST, ABDOMEN AND PELVIS WITH CONTRAST 01/25/2024 01:53:19 AM TECHNIQUE: CT of the chest, abdomen and pelvis was performed with the administration of intravenous contrast. Multiplanar reformatted images are provided for review. Automated exposure control, iterative reconstruction, and/or weight based adjustment of the mA/kV was utilized to reduce the radiation dose to as low as reasonably achievable. COMPARISON: CTA chest and CT abdomen/pelvis dated 01/14/2020. CLINICAL HISTORY: Sepsis. Pt to ED via [facility] from home c/o vomiting and weakness. Per ems pt has been sick with bronchitis x1week and has been taking antibiotics and started steroids Sunday. Vomiting started tonight. Pt received 4mg  zofran  and 250ml NS. Hx HTN. FINDINGS: CHEST:  MEDIASTINUM: Heart and pericardium are unremarkable. The central airways are clear. THORACIC LYMPH NODES: No mediastinal, hilar or axillary lymphadenopathy. LUNGS AND PLEURA: Mild posterior right lower lobe opacity, favoring mild infection/pneumonia over atelectasis/scarring. Additional mild patchy opacities in the lingula and left lower lobe possibly atelectasis. No pleural effusion or pneumothorax. ABDOMEN AND PELVIS: LIVER: 3.3 cm central right hepatic cyst, benign. GALLBLADDER AND BILE DUCTS: Layering small gallstones, without associated inflammatory changes. No biliary ductal dilatation. SPLEEN: No acute abnormality. PANCREAS: No acute abnormality. 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 No follow-up is recommended. KIDNEYS, URETERS AND BLADDER: 14 mm simple right lower pole renal cyst, benign (Bosniak 1). No follow-up is recommended. 4 mm calculus in the left proximal collecting system/ureter. No hydronephrosis. No perinephric or periureteral stranding. Urinary bladder is unremarkable. GI AND BOWEL: Stomach demonstrates no acute abnormality. There is no bowel obstruction. Sigmoid diverticulosis, without evidence of diverticulitis.  The appendix is not discretely visualized, favored to be surgically absent. REPRODUCTIVE ORGANS: Status post hysterectomy. PERITONEUM AND RETROPERITONEUM: No ascites. No free air. VASCULATURE: Aorta is normal in caliber. ABDOMINAL AND PELVIS LYMPH NODES: No lymphadenopathy. BONES AND SOFT TISSUES: Bilateral shoulder arthroplasties. Chronic mild anterior compression fracture deformities at T12-L1. L2-S1 lumbar fixation hardware. Mild multilevel degenerative changes of the lower thoracic and lumbar spine. No focal soft tissue abnormality. IMPRESSION: 1. Mild posterior right lower lobe opacity, favoring mild infection/pneumonia over atelectasis/scarring. 2. 4 mm calculus in the left proximal collecting system/ureter. No  hydronephrosis. 3. Additional ancillary findings as above. Electronically signed by: Zadie Herter MD 01/25/2024 02:02 AM EDT RP Workstation: [id number]   DG Chest Port 1 View Result Date: 01/25/2024 EXAM: 1 VIEW XRAY OF THE CHEST 01/25/2024 12:44:00 AM COMPARISON: CT chest dated 01/14/2020. CLINICAL HISTORY: [id number] Sepsis (HCC) [id number]. Questionable code sepsis- eval for abnormality. Patient states has been on antibiotics and steroids for bronchitis x 1 week. Patient on NRB. Hx of HTN and former smoker. FINDINGS: LUNGS AND PLEURA: Low lung volumes. Mild left basilar atelectasis. No focal pulmonary opacity. No pulmonary edema. No pleural effusion. No pneumothorax. HEART AND MEDIASTINUM: No acute abnormality of the cardiac and mediastinal silhouettes. BONES AND SOFT TISSUES: Bilateral shoulder arthroplasties. Lumbar spine fixation hardware, incompletely visualized. No acute osseous abnormality. IMPRESSION: 1. Low lung volumes. 2. Mild left basilar atelectasis. Electronically signed by: Zadie Herter MD 01/25/2024 01:00 AM EDT RP Workstation: [id number]      IMPRESSION AND PLAN:  Assessment and Plan: * Sepsis due to pneumonia (HCC) - Sepsis is manifested by tachycardia and tachypnea. - The patient will be admitted to a medical telemetry bed. - Will continue antibiotic therapy with IV Rocephin and Zithromax. - Mucolytic therapy be provided as well as duo nebs q.i.d. and q.4 hours p.r.n. - We will follow blood cultures.    Acute respiratory failure with hypoxia (HCC) - This is clearly secondary to #1. - Management as above. - O2 protocol will be followed.  GERD without esophagitis - Will continue PPI therapy.  Anxiety and depression - Will continue Cymbalta  and Tofranil   Dyslipidemia - Will continue statin therapy.  Essential hypertension - Continue antihypertensive therapy.   DVT prophylaxis: Lovenox .  Advanced Care Planning:  Code Status: full code.  Family Communication:   The plan of care was discussed in details with the patient (and family). I answered all questions. The patient agreed to proceed with the above mentioned plan. Further management will depend upon hospital course. Disposition Plan: Back to previous home environment Consults called: none.  All the records are reviewed and case discussed with ED provider.  Status is: Inpatient  At the time of the admission, it appears that the appropriate admission status for this patient is inpatient.  This is judged to be reasonable and necessary in order to provide the required intensity of service to ensure the patient's safety given the presenting symptoms, physical exam findings and initial radiographic and laboratory data in the context of comorbid conditions.  The patient requires inpatient status due to high intensity of service, high risk of further deterioration and high frequency of surveillance required.  I certify that at the time of admission, it is my clinical judgment that the patient will require inpatient hospital care extending more than 2 midnights.                            Dispo: The patient is from:  Home              Anticipated d/c is to: Home              Patient currently is not medically stable to d/c.              Difficult to place patient: No  Virgene Griffin M.D on 01/25/2024 at 7:50 AM  [facility]   From 7 PM-7 AM, contact night-coverage [email or web address]  CC: Primary care physician; Rex Castor, MD

## 2024-01-25 NOTE — Progress Notes (Signed)
 Progress Note   Patient: Jessica Leonard DOB: 06/18/1941 DOA: 01/24/2024     0 DOS: the patient was seen and examined on 01/25/2024   Brief hospital course:  Jessica Leonard is a 83 y.o. female with medical history significant for anxiety, depression, chronic back pain, essential hypertension, and dyslipidemia who presented to the emergency room, presented to the emergency room with a calcium  of worsening dyspnea with associated cough and wheezing over the last week.  The patient admitted to chills but denies any measured fever.  No nausea or vomiting or abdominal pain.  No dysuria, oliguria or hematuria or flank pain.  No chest pain or palpitations.  She was found to be hypoxic by EMS with a pulse oximetry of 75% on room air.  She does not wear oxygen at home.  She was seen by her PCP on 6/12 and diagnosed with bronchitis.  She was placed on doxycycline and prednisone.  She was initially in the ER on nonrebreather.   ED Course: When she came to the ER, BP was 175/96 with temperature 97.4 axillary and respiratory to 25 and pulse oximetry 75% on room air and 94% on high flow nasal cannula 11L/min.  Heart rate later on was 92.  Labs revealed hypokalemia 3.2 blood glucose 136 with total protein of 6.3 and total bili of 1.3.  Lactic acid was 2.5 and procalcitonin less than 0.1.  High-sensitivity troponin was 7.  CBC showed hemoconcentration.  INR is 1 and PT 13.7.  UA showed 6-10 WBCs with no bacteria and negative nitrite. EKG as reviewed by me : EKG showed sinus rhythm with rate of 87 with left atrial enlargement, right bundle branch block and left intrafascicular block. Imaging: Forward chest x-ray showed low lung volumes and mild left basal atelectasis.  Chest abdomen pelvic CT scan with contrast revealed the following: 1. Mild posterior right lower lobe opacity, favoring mild infection/pneumonia over atelectasis/scarring. 2. 4 mm calculus in the left proximal collecting system/ureter.  No hydronephrosis. 3. Additional ancillary findings as above.     Assessment and Plan: Sepsis due to pneumonia (HCC) - Sepsis is manifested by tachycardia and tachypnea. Continue telemetry Continue intranasal oxygen Continue ceftriaxone and azithromycin We will monitor respiratory function closely CODE STATUS discussed with patient and she agreed to be full code if needed to be on ventilator     Acute respiratory failure with hypoxia (HCC) - Secondary to community-acquired pneumonia - Management as above. - O2 protocol will be followed.   GERD without esophagitis Continue PPI therapy.   Anxiety and depression - Will continue Cymbalta  and Tofranil    Dyslipidemia - Will continue statin therapy.   Essential hypertension - Continue antihypertensive therapy.     DVT prophylaxis: Lovenox .   Advanced Care Planning:  Code Status: full code.   Disposition Plan: Back to previous home environment  Subjective:  Patient seen and examined at bedside this morning Currently requiring 13 L of oxygen and maintaining saturation around 95% Denies nausea vomiting chest pain worsening abdominal pain or cough  Physical Exam:  GENERAL:  83 y.o.-year-old patient lying in the bed with mild respiratory distress with conversational dyspnea. EYES: Pupils equal, round, reactive to light and accommodation. No scleral icterus. Extraocular muscles intact.  HEENT: Head atraumatic, normocephalic. Oropharynx and nasopharynx clear.  NECK:  Supple, no jugular venous distention. No thyroid enlargement, no tenderness.  LUNGS: Decreased air entry bibasilarly CARDIOVASCULAR: Regular rate and rhythm, S1, S2 normal. No murmurs, rubs, or gallops.  ABDOMEN: Soft,  nondistended, nontender. Bowel sounds present. No organomegaly or mass.  EXTREMITIES: No pedal edema, cyanosis, or clubbing.  NEUROLOGIC: Cranial nerves II through XII are intact. Muscle strength 5/5 in all extremities. Sensation intact. Gait not  checked.  PSYCHIATRIC: The patient is alert and oriented x 3.  Normal affect and good eye contact. SKIN: No obvious rash, lesion, or ulcer.   Vitals:   01/25/24 1530 01/25/24 1545 01/25/24 1600 01/25/24 1630  BP:   (!) 143/68 130/61  Pulse: 80 80 81 85  Resp:   20 19  Temp:      TempSrc:      SpO2: 94% 94% 95% 96%  Weight:      Height:        Data Reviewed: CT scan showing findings of right lower lung infiltrate    Latest Ref Rng & Units 01/25/2024   12:14 AM 01/15/2020    4:28 AM 01/14/2020   10:25 AM  CBC  WBC 4.0 - 10.5 K/uL 9.8  10.5  7.0   Hemoglobin 12.0 - 15.0 g/dL 82.9  56.2  13.0   Hematocrit 36.0 - 46.0 % 47.3  43.7  49.8   Platelets 150 - 400 K/uL 231  186  206        Latest Ref Rng & Units 01/25/2024    4:18 AM 01/25/2024   12:14 AM 01/15/2020    4:28 AM  BMP  Glucose 70 - 99 mg/dL 865  784  696   BUN 8 - 23 mg/dL 17  19  23    Creatinine 0.44 - 1.00 mg/dL 2.95  2.84  1.32   Sodium 135 - 145 mmol/L 139  141  139   Potassium 3.5 - 5.1 mmol/L 4.0  3.2  3.3   Chloride 98 - 111 mmol/L 104  105  104   CO2 22 - 32 mmol/L 25  24  27    Calcium  8.9 - 10.3 mg/dL 9.6  44.0  9.4      Family Communication: None at bedside  Disposition: Status is: Inpatient   Planned Discharge Destination:  Time spent: 58 minutes  Author: Ezzard Holms, MD 01/25/2024 5:26 PM  For on call review [email or web address].

## 2024-01-25 NOTE — ED Notes (Signed)
 Pharmacy stocking pyxis at this time. Delay in getting meds for pt.

## 2024-01-25 NOTE — ED Notes (Signed)
 Pt placed on NRB.

## 2024-01-25 NOTE — ED Notes (Signed)
 CCMD called to initiate cardiac monitoring

## 2024-01-25 NOTE — Assessment & Plan Note (Signed)
-   Sepsis is manifested by tachycardia and tachypnea. - The patient will be admitted to a medical telemetry bed. - Will continue antibiotic therapy with IV Rocephin and Zithromax. - Mucolytic therapy be provided as well as duo nebs q.i.d. and q.4 hours p.r.n. - We will follow blood cultures.

## 2024-01-25 NOTE — ED Triage Notes (Addendum)
 Pt to ED via [facility] from home c/o vomiting and weakness. Per ems pt has been sick with bronchitis x1week and has been taking antibiotics and started steroids Sunday. Vomiting started tonight. Pt received 4mg  zofran  and 250ml NS. Hx HTN

## 2024-01-25 NOTE — Assessment & Plan Note (Signed)
-   This is clearly secondary to #1. - Management as above. - O2 protocol will be followed.

## 2024-01-25 NOTE — ED Provider Notes (Signed)
 [facility] Provider Note    Event Date/Time   First MD Initiated Contact with Patient 01/25/24 0002     (approximate)   History   No chief complaint on file.   HPI  Jessica Leonard is a 83 y.o. female with history of hypertension, hyperlipidemia, kidney stones who presents to the emergency department with complaints of cough, congestion for the past week and now vomiting and generalized weakness tonight.  Denies acute diarrhea, abdominal pain.  No chest pain.  Denies feeling short of breath but EMS reports sats are 75% on room air at rest.  She does not wear oxygen chronically.  She was seen by her primary care doctor on 01/19/2024 and diagnosed with bronchitis and started on prednisone, doxycycline.  Patient currently on a nonrebreather.   History provided by patient, family, EMS.    Past Medical History:  Diagnosis Date   Anxiety    Complication of anesthesia    hard time waking up after hysterectomy   Depression    Epigastric pain    Hypertension     Past Surgical History:  Procedure Laterality Date   ABDOMINAL HYSTERECTOMY     APPENDECTOMY     back fusion     BACK SURGERY     BUNIONECTOMY     cataracts     COLONOSCOPY WITH PROPOFOL  N/A 10/08/2020   Procedure: COLONOSCOPY WITH PROPOFOL ;  Surgeon: Selena Daily, MD;  Location: [facility];  Service: Gastroenterology;  Laterality: N/A;   ESOPHAGOGASTRODUODENOSCOPY (EGD) WITH PROPOFOL  N/A 01/15/2020   Procedure: ESOPHAGOGASTRODUODENOSCOPY (EGD) WITH PROPOFOL ;  Surgeon: Selena Daily, MD;  Location: [facility];  Service: Gastroenterology;  Laterality: N/A;   ESOPHAGOGASTRODUODENOSCOPY (EGD) WITH PROPOFOL  N/A 10/08/2020   Procedure: ESOPHAGOGASTRODUODENOSCOPY (EGD) WITH PROPOFOL ;  Surgeon: Selena Daily, MD;  Location: [facility];  Service: Gastroenterology;  Laterality: N/A;   EYE SURGERY     INCONTINENCE SURGERY     TOTAL SHOULDER ARTHROPLASTY Bilateral      MEDICATIONS:  Prior to Admission medications   Medication Sig Start Date End Date Taking? Authorizing Provider  acetaminophen  (TYLENOL ) 500 MG tablet Take 500 mg by mouth 2 (two) times daily.    [provider]  atorvastatin  (LIPITOR) 20 MG tablet Take 20 mg by mouth daily. 12/26/19   [provider]  Calcium  Carb-Cholecalciferol (CALCIUM  600+D) 600-800 MG-UNIT TABS Take 1 tablet by mouth daily.    [provider]  Cholecalciferol 25 MCG (1000 UT) tablet Take by mouth.    [provider]  Cyanocobalamin 1000 MCG SUBL Place under the tongue.    [provider]  DULoxetine  (CYMBALTA ) 60 MG capsule Take 60 mg by mouth daily. 12/26/19   [provider]  imipramine  (TOFRANIL ) 25 MG tablet Take 25 mg by mouth at bedtime. 12/31/19   [provider]  Multiple Vitamins-Minerals (PRESERVISION AREDS 2) CAPS Take 1 capsule by mouth 2 (two) times daily.    [provider]  NIFEdipine  (PROCARDIA  XL/NIFEDICAL XL) 60 MG 24 hr tablet Take 60 mg by mouth daily. 12/26/19   [provider]  pantoprazole  (PROTONIX ) 40 MG tablet Take 1 tablet (40 mg total) by mouth 2 (two) times daily. 01/15/20   Patel, Sona, MD  psyllium (METAMUCIL) 58.6 % packet Take 1 packet by mouth daily.    [provider]    Physical Exam   Triage Vital Signs: ED Triage Vitals  Encounter Vitals Group     BP 01/25/24 0005 (!) 175/96  Girls Systolic BP Percentile --      Girls Diastolic BP Percentile --      Boys Systolic BP Percentile --      Boys Diastolic BP Percentile --      Pulse Rate 01/25/24 0005 88     Resp 01/25/24 0005 (!) 25     Temp 01/25/24 0005 (!) 97.4 F (36.3 C)     Temp Source 01/25/24 0005 Axillary     SpO2 01/25/24 0005 (!) 75 %     Weight --      Height --      Head Circumference --      Peak Flow --      Pain Score 01/25/24 0001 0     Pain Loc --      Pain Education --      Exclude from Growth Chart --     Most  recent vital signs: Vitals:   01/25/24 0030 01/25/24 0032  BP: 120/83   Pulse: 88   Resp: 18   Temp:    SpO2: 97% 94%    CONSTITUTIONAL: Alert, responds appropriately to questions.  Elderly. HEAD: Normocephalic, atraumatic EYES: Conjunctivae clear, pupils appear equal, sclera nonicteric ENT: normal nose; moist mucous membranes NECK: Supple, normal ROM CARD: RRR; S1 and S2 appreciated RESP: Patient is slightly tachypneic, hypoxic but speaking full sentences.  Diminished aeration at bases but no wheezing, rhonchi or rales. ABD/GI: Non-distended; soft, non-tender, no rebound, no guarding, no peritoneal signs BACK: The back appears normal EXT: Normal ROM in all joints; no deformity noted, no edema SKIN: Normal color for age and race; warm; no rash on exposed skin NEURO: Moves all extremities equally, normal speech PSYCH: The patient's mood and manner are appropriate.   ED Results / Procedures / Treatments   LABS: (all labs ordered are listed, but only abnormal results are displayed) Labs Reviewed  COMPREHENSIVE METABOLIC PANEL WITH GFR - Abnormal; Notable for the following components:      Result Value   Potassium 3.2 (*)    Glucose, Bld 136 (*)    Total Protein 6.3 (*)    Total Bilirubin 1.3 (*)    All other components within normal limits  LACTIC ACID, PLASMA - Abnormal; Notable for the following components:   Lactic Acid, Venous 2.5 (*)    All other components within normal limits  LACTIC ACID, PLASMA - Abnormal; Notable for the following components:   Lactic Acid, Venous 3.7 (*)    All other components within normal limits  CBC WITH DIFFERENTIAL/PLATELET - Abnormal; Notable for the following components:   Hemoglobin 16.0 (*)    HCT 47.3 (*)    Abs Immature Granulocytes 0.08 (*)    All other components within normal limits  URINALYSIS, W/ REFLEX TO CULTURE (INFECTION SUSPECTED) - Abnormal; Notable for the following components:   Color, Urine YELLOW (*)    APPearance  CLEAR (*)    All other components within normal limits  BLOOD GAS, VENOUS - Abnormal; Notable for the following components:   Bicarbonate 30.3 (*)    Acid-Base Excess 4.2 (*)    All other components within normal limits  RESP PANEL BY RT-PCR (RSV, FLU A&B, COVID)  [id number]  CULTURE, BLOOD (ROUTINE X 2)  CULTURE, BLOOD (ROUTINE X 2)  URINE CULTURE  PROTIME-INR  PROCALCITONIN  LACTIC ACID, PLASMA  LACTIC ACID, PLASMA  TROPONIN I (HIGH SENSITIVITY)     EKG:  EKG Interpretation Date/Time:  Wednesday January 25 2024 00:07:08 EDT Ventricular  Rate:  87 PR Interval:  202 QRS Duration:  167 QT Interval:  411 QTC Calculation: 495 R Axis:   -80  Text Interpretation: Sinus rhythm Left atrial enlargement RBBB and LAFB No significant change since last tracing Confirmed by Verneda Golder [phone]) on 01/25/2024 12:10:06 AM         RADIOLOGY: My personal review and interpretation of imaging: CT of the chest shows right lower lobe pneumonia.  I have personally reviewed all radiology reports.   CT CHEST ABDOMEN PELVIS W CONTRAST Result Date: 01/25/2024 EXAM: CT CHEST, ABDOMEN AND PELVIS WITH CONTRAST 01/25/2024 01:53:19 AM TECHNIQUE: CT of the chest, abdomen and pelvis was performed with the administration of intravenous contrast. Multiplanar reformatted images are provided for review. Automated exposure control, iterative reconstruction, and/or weight based adjustment of the mA/kV was utilized to reduce the radiation dose to as low as reasonably achievable. COMPARISON: CTA chest and CT abdomen/pelvis dated 01/14/2020. CLINICAL HISTORY: Sepsis. Pt to ED via [facility] from home c/o vomiting and weakness. Per ems pt has been sick with bronchitis x1week and has been taking antibiotics and started steroids Sunday. Vomiting started tonight. Pt received 4mg  zofran  and 250ml NS. Hx HTN. FINDINGS: CHEST: MEDIASTINUM: Heart and pericardium are unremarkable. The central airways are clear. THORACIC LYMPH NODES: No  mediastinal, hilar or axillary lymphadenopathy. LUNGS AND PLEURA: Mild posterior right lower lobe opacity, favoring mild infection/pneumonia over atelectasis/scarring. Additional mild patchy opacities in the lingula and left lower lobe possibly atelectasis. No pleural effusion or pneumothorax. ABDOMEN AND PELVIS: LIVER: 3.3 cm central right hepatic cyst, benign. GALLBLADDER AND BILE DUCTS: Layering small gallstones, without associated inflammatory changes. No biliary ductal dilatation. SPLEEN: No acute abnormality. PANCREAS: No acute abnormality. 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 No follow-up is recommended. KIDNEYS, URETERS AND BLADDER: 14 mm simple right lower pole renal cyst, benign (Bosniak 1). No follow-up is recommended. 4 mm calculus in the left proximal collecting system/ureter. No hydronephrosis. No perinephric or periureteral stranding. Urinary bladder is unremarkable. GI AND BOWEL: Stomach demonstrates no acute abnormality. There is no bowel obstruction. Sigmoid diverticulosis, without evidence of diverticulitis. The appendix is not discretely visualized, favored to be surgically absent. REPRODUCTIVE ORGANS: Status post hysterectomy. PERITONEUM AND RETROPERITONEUM: No ascites. No free air. VASCULATURE: Aorta is normal in caliber. ABDOMINAL AND PELVIS LYMPH NODES: No lymphadenopathy. BONES AND SOFT TISSUES: Bilateral shoulder arthroplasties. Chronic mild anterior compression fracture deformities at T12-L1. L2-S1 lumbar fixation hardware. Mild multilevel degenerative changes of the lower thoracic and lumbar spine. No focal soft tissue abnormality. IMPRESSION: 1. Mild posterior right lower lobe opacity, favoring mild infection/pneumonia over atelectasis/scarring. 2. 4 mm calculus in the left proximal collecting system/ureter. No hydronephrosis. 3. Additional ancillary findings as above. Electronically signed by: Zadie Herter MD  01/25/2024 02:02 AM EDT RP Workstation: [id number]   DG Chest Port 1 View Result Date: 01/25/2024 EXAM: 1 VIEW XRAY OF THE CHEST 01/25/2024 12:44:00 AM COMPARISON: CT chest dated 01/14/2020. CLINICAL HISTORY: [id number] Sepsis (HCC) [id number]. Questionable code sepsis- eval for abnormality. Patient states has been on antibiotics and steroids for bronchitis x 1 week. Patient on NRB. Hx of HTN and former smoker. FINDINGS: LUNGS AND PLEURA: Low lung volumes. Mild left basilar atelectasis. No focal pulmonary opacity. No pulmonary edema. No pleural effusion. No pneumothorax. HEART AND MEDIASTINUM: No acute abnormality of the cardiac and mediastinal silhouettes. BONES AND SOFT TISSUES: Bilateral shoulder arthroplasties. Lumbar spine fixation hardware, incompletely visualized. No acute osseous abnormality. IMPRESSION: 1.  Low lung volumes. 2. Mild left basilar atelectasis. Electronically signed by: Zadie Herter MD 01/25/2024 01:00 AM EDT RP Workstation: [id number]     PROCEDURES:  Critical Care performed: Yes, see critical care procedure note(s)   CRITICAL CARE Performed by: Verneda Golder   Total critical care time: 30 minutes  Critical care time was exclusive of separately billable procedures and treating other patients.  Critical care was necessary to treat or prevent imminent or life-threatening deterioration.  Critical care was time spent personally by me on the following activities: development of treatment plan with patient and/or surrogate as well as nursing, discussions with consultants, evaluation of patient's response to treatment, examination of patient, obtaining history from patient or surrogate, ordering and performing treatments and interventions, ordering and review of laboratory studies, ordering and review of radiographic studies, pulse oximetry and re-evaluation of patient's condition.   Aaron Aas1-3 Lead EKG Interpretation  Performed by: Shanti Eichel, Clover Dao, DO Authorized by: Christell Steinmiller, Clover Dao, DO     Interpretation: normal     ECG rate:  88   ECG rate assessment: normal     Rhythm: sinus rhythm     Ectopy: none     Conduction: normal       IMPRESSION / MDM / ASSESSMENT AND PLAN / ED COURSE  I reviewed the triage vital signs and the nursing notes.    Patient here with cough, congestion, vomiting.  Tachycardic, tachypneic and hypoxic.  Rectal temperature of 97.3.  The patient is on the cardiac monitor to evaluate for evidence of arrhythmia and/or significant heart rate changes.   DIFFERENTIAL DIAGNOSIS (includes but not limited to):   Sepsis, pneumonia, viral URI, CHF, PE, pneumothorax, COPD, UTI, viral gastroenteritis, colitis, diverticulitis, appendicitis   Patient's presentation is most consistent with acute presentation with potential threat to life or bodily function.   PLAN: Will initiate septic workup.  Will obtain CT of the chest, abdomen and pelvis.  Will obtain labs, urine, cultures.  Will give IV fluids, broad-spectrum antibiotics.  Patient transition from nonrebreather to high flow nasal cannula.   MEDICATIONS GIVEN IN ED: Medications  lactated ringers infusion (has no administration in time range)  vancomycin (VANCOCIN) IVPB 1000 mg/200 mL premix (1,000 mg Intravenous New Bag/Given 01/25/24 0214)  potassium chloride  10 mEq in 100 mL IVPB (10 mEq Intravenous New Bag/Given 01/25/24 0307)  lactated ringers bolus 1,000 mL (1,000 mLs Intravenous New Bag/Given 01/25/24 0050)  ceFEPIme (MAXIPIME) 2 g in sodium chloride  0.9 % 100 mL IVPB (0 g Intravenous Stopped 01/25/24 0207)  metroNIDAZOLE (FLAGYL) IVPB 500 mg (0 mg Intravenous Stopped 01/25/24 0207)  ondansetron  (ZOFRAN ) injection 4 mg (4 mg Intravenous Given 01/25/24 0039)  lactated ringers bolus 1,000 mL (1,000 mLs Intravenous New Bag/Given 01/25/24 0217)  lactated ringers bolus 1,000 mL (1,000 mLs Intravenous New Bag/Given 01/25/24 0216)  iohexol  (OMNIPAQUE ) 300 MG/ML solution 100 mL (100 mLs Intravenous  Contrast Given 01/25/24 0152)     ED COURSE: Patient's labs show no leukocytosis.  Initial lactic of 2.5.  She is getting 30 mL/kg IV fluid bolus.  Urine shows pyuria but no bacteria or other sign of infection.  Chest x-ray reviewed and interpreted by myself and the radiologist and is clear but CT of the chest, abdomen pelvis showed right lower lobe pneumonia and also shows a 4 mm left proximal ureteral stone with no hydronephrosis.  I do not think that her sepsis is secondary to an infected stone.  She is not having any flank or abdominal pain.  I believe that her sepsis is secondary to pneumonia.  I do not feel she needs emergent urologic evaluation.  Will add on urine culture.   VBG reassuring.  COVID, flu and RSV negative.  Troponin negative.  Repeat lactic has gone up to 3.7 but she had not yet completed all 3 L of fluids.  Repeat has been ordered.   Patient improving clinically.  Heart rate, respiratory rate, oxygen saturation has improved.  Patient has been normotensive, mentating well.  CONSULTS:  Consulted and discussed patient's case with hospitalist, Dr. Achilles Holes.  I have recommended admission and consulting physician agrees and will place admission orders.  Patient (and family if present) agree with this plan.   I reviewed all nursing notes, vitals, pertinent previous records.  All labs, EKGs, imaging ordered have been independently reviewed and interpreted by myself.    OUTSIDE RECORDS REVIEWED: Reviewed recent PCP note.       FINAL CLINICAL IMPRESSION(S) / ED DIAGNOSES   Final diagnoses:  Acute respiratory failure with hypoxia (HCC)  Nausea and vomiting in adult  Pneumonia of right lower lobe due to infectious organism  Kidney stone     Rx / DC Orders   ED Discharge Orders     None        Note:  This document was prepared using Dragon voice recognition software and may include unintentional dictation errors.   Delton Stelle, Clover Dao, DO 01/25/24 [phone]

## 2024-01-25 NOTE — Assessment & Plan Note (Signed)
 Will continue PPI therapy.

## 2024-01-26 DIAGNOSIS — A419 Sepsis, unspecified organism: Secondary | ICD-10-CM | POA: Diagnosis not present

## 2024-01-26 DIAGNOSIS — J189 Pneumonia, unspecified organism: Secondary | ICD-10-CM | POA: Diagnosis not present

## 2024-01-26 LAB — CBC WITH DIFFERENTIAL/PLATELET
Abs Immature Granulocytes: 0.04 10*3/uL (ref 0.00–0.07)
Basophils Absolute: 0 10*3/uL (ref 0.0–0.1)
Basophils Relative: 1 %
Eosinophils Absolute: 0.2 10*3/uL (ref 0.0–0.5)
Eosinophils Relative: 2 %
HCT: 44.5 % (ref 36.0–46.0)
Hemoglobin: 14.3 g/dL (ref 12.0–15.0)
Immature Granulocytes: 1 %
Lymphocytes Relative: 32 %
Lymphs Abs: 2.7 10*3/uL (ref 0.7–4.0)
MCH: 31.3 pg (ref 26.0–34.0)
MCHC: 32.1 g/dL (ref 30.0–36.0)
MCV: 97.4 fL (ref 80.0–100.0)
Monocytes Absolute: 1 10*3/uL (ref 0.1–1.0)
Monocytes Relative: 11 %
Neutro Abs: 4.6 10*3/uL (ref 1.7–7.7)
Neutrophils Relative %: 53 %
Platelets: 204 10*3/uL (ref 150–400)
RBC: 4.57 MIL/uL (ref 3.87–5.11)
RDW: 13.8 % (ref 11.5–15.5)
WBC: 8.5 10*3/uL (ref 4.0–10.5)
nRBC: 0 % (ref 0.0–0.2)

## 2024-01-26 LAB — BASIC METABOLIC PANEL WITH GFR
Anion gap: 9 (ref 5–15)
BUN: 17 mg/dL (ref 8–23)
CO2: 29 mmol/L (ref 22–32)
Calcium: 8.8 mg/dL — ABNORMAL LOW (ref 8.9–10.3)
Chloride: 103 mmol/L (ref 98–111)
Creatinine, Ser: 0.68 mg/dL (ref 0.44–1.00)
GFR, Estimated: >60 mL/min (ref >60)
Glucose, Bld: 89 mg/dL (ref 70–99)
Potassium: 3.7 mmol/L (ref 3.5–5.1)
Sodium: 141 mmol/L (ref 135–145)

## 2024-01-26 LAB — URINE CULTURE: Culture: NO GROWTH

## 2024-01-26 NOTE — ED Notes (Signed)
Informed RN bed assigned 

## 2024-01-26 NOTE — ED Notes (Signed)
 Pt O2 sats 93% on 5L

## 2024-01-26 NOTE — Evaluation (Signed)
 Occupational Therapy Evaluation Patient Details Name: Jessica Leonard MRN: [id number] DOB: 04/29/1941 Today's Date: 01/26/2024   History of Present Illness   Pt is an 83 y.o. female who presented to the ER with a calcium  of worsening dyspnea with associated cough and wheezing over the last week. Seen by her PCP on 6/12 and diagnosed with bronchitis. Admitted for management of sepsis d/t pneumonia, acute respiratory failure with hypoxia. PMH of anxiety, depression, chronic back pain, essential hypertension, and dyslipidemia     Clinical Impressions Pt was seen for OT evaluation this date. PTA, pt resides at home with a live in paid caregiver. Pt has a ramp to enter. Utilizes a rollator at home, has motorized scooter or W/C for MD appts/community distances. She is IND with ADLs and medication management and paid caregiver manages household chores.  Pt presents to acute OT demonstrating impaired ADL performance and functional mobility 2/2 low activity tolerance. Pt currently requires MOD I/SUP for bed mobility. Bed noted to be soiled d/t purewick malfunction requiring linen change and clothing change. Min A for UB ADLs d/t manage lines/leads. Pt on 6L HFNC on entry with sp02 93-94%. Able to perform STS and SPT to [facility] with RW and CGA/SBA with sp02 between 86-90%. LB dressing performed with Min to CGA. Removed sp02 to ambulate around the bed ~5 feet holding to bed rail and sp02 dropped to 84-87% (differing readings from [facility] and ED pulse ox). Pt not reporting SOB/DOE. Placed back on 4.5L HFNC and at 92% on OT exit. Caregiver present throughout session and believes pt is moving around near her baseline with only difference being acute need for 02. Pt would benefit from skilled OT services to address noted impairments and functional limitations (see below for any additional details) in order to maximize safety and independence while minimizing falls risk and caregiver burden. Do anticipate the need for follow  up OT services upon acute hospital DC.      If plan is discharge home, recommend the following:   A little help with walking and/or transfers;A little help with bathing/dressing/bathroom     Functional Status Assessment   Patient has had a recent decline in their functional status and demonstrates the ability to make significant improvements in function in a reasonable and predictable amount of time.     Equipment Recommendations   Other (comment) (oxygen)     Recommendations for Other Services         Precautions/Restrictions   Precautions Precautions: Fall Recall of Precautions/Restrictions: Intact Precaution/Restrictions Comments: watch 02 Restrictions Weight Bearing Restrictions Per Provider Order: No     Mobility Bed Mobility Overal bed mobility: Modified Independent             General bed mobility comments: increased time/effort, no physical assist needed    Transfers Overall transfer level: Needs assistance Equipment used: Rolling walker (2 wheels) Transfers: Sit to/from Stand, Bed to chair/wheelchair/BSC Sit to Stand: Supervision, Contact guard assist     Step pivot transfers: Contact guard assist     General transfer comment: utilized RW initially, then pt transitioned to no AD and held to bed rail to ambulate around bed to recliner without LOB; CGA to SBA for SPT to [facility]      Balance Overall balance assessment: Needs assistance Sitting-balance support: Feet supported Sitting balance-Leahy Scale: Good     Standing balance support: Reliant on assistive device for balance, During functional activity, Single extremity supported Standing balance-Leahy Scale: Fair Standing balance comment: no LOB during  session                           ADL either performed or assessed with clinical judgement   ADL Overall ADL's : Needs assistance/impaired                 Upper Body Dressing : Minimal assistance;Sitting Upper Body  Dressing Details (indicate cue type and reason): on BSC to manage lines/leads Lower Body Dressing: Contact guard assist;Sit to/from stand Lower Body Dressing Details (indicate cue type and reason): to doff soiled underwear and don brief Toilet Transfer: BSC/3in1;Rolling walker (2 wheels);Contact guard assist Toilet Transfer Details (indicate cue type and reason): SPT Toileting- Clothing Manipulation and Hygiene: Supervision/safety;Sitting/lateral lean Toileting - Clothing Manipulation Details (indicate cue type and reason): anterior hygiene     Functional mobility during ADLs: Contact guard assist;Supervision/safety;Rolling walker (2 wheels)       Vision         Perception         Praxis         Pertinent Vitals/Pain Pain Assessment Pain Assessment: 0-10 Pain Score: 8  Pain Location: chronic back and knee pain Pain Descriptors / Indicators: Aching, Tightness, Sore Pain Intervention(s): Monitored during session, Limited activity within patient's tolerance, Repositioned     Extremity/Trunk Assessment Upper Extremity Assessment Upper Extremity Assessment: Overall WFL for tasks assessed   Lower Extremity Assessment Lower Extremity Assessment: Defer to PT evaluation       Communication Communication Communication: No apparent difficulties   Cognition Arousal: Alert Behavior During Therapy: WFL for tasks assessed/performed Cognition: No apparent impairments                               Following commands: Intact       Cueing  General Comments      on 6L on entry with sp02 stable at 93-94% and maintained during SPT; removed to ambulate around bed and dropped to 84-87% conflicting readings on pulse ox in ED vs HH pulse ox; placed back on 4.5L at 92% on exit   Exercises Other Exercises Other Exercises: Edu on role of OT in acute setting.   Shoulder Instructions      Home Living Family/patient expects to be discharged to:: Private  residence Living Arrangements: Non-relatives/Friends (live in caregiver) Available Help at Discharge: Personal care attendant;Available 24 hours/day Type of Home: House Home Access: Ramped entrance     Home Layout: One level     Bathroom Shower/Tub: Walk-in shower;Tub/shower unit (uses walk-in)   Bathroom Toilet: Handicapped height     Home Equipment: Educational psychologist (4 wheels);Wheelchair - Research officer, trade union - single point;Hand held shower head;Grab bars - tub/shower          Prior Functioning/Environment Prior Level of Function : Needs assist;Driving             Mobility Comments: uses rollator household distances; caregiver pushes her in on W/C or drives motorized scooter into MD appts ADLs Comments: IND, has hired help to manage all household chores; pt manages her own meds    OT Problem List: Decreased strength;Decreased activity tolerance   OT Treatment/Interventions: Self-care/ADL training;Therapeutic exercise;Therapeutic activities;Patient/family education;DME and/or AE instruction;Energy conservation      OT Goals(Current goals can be found in the care plan section)   Acute Rehab OT Goals Patient Stated Goal: return home OT Goal Formulation: With patient/family Time For Goal Achievement: 02/09/24 Potential  to Achieve Goals: Good ADL Goals Pt Will Perform Lower Body Bathing: with supervision;sitting/lateral leans;sit to/from stand Pt Will Perform Lower Body Dressing: with supervision;sitting/lateral leans;sit to/from stand Pt Will Transfer to Toilet: with supervision;ambulating;regular height toilet Additional ADL Goal #1: Pt will demo implementation of 1 learned ECS during ADL performance and transfers 2/2 trials to promote IND/safety and prevent overexertion.   OT Frequency:  Min 2X/week    Co-evaluation              AM-PAC OT 6 Clicks Daily Activity     Outcome Measure Help from another person eating meals?: None Help from  another person taking care of personal grooming?: None Help from another person toileting, which includes using toliet, bedpan, or urinal?: A Little Help from another person bathing (including washing, rinsing, drying)?: A Little Help from another person to put on and taking off regular upper body clothing?: A Little Help from another person to put on and taking off regular lower body clothing?: A Little 6 Click Score: 20   End of Session Equipment Utilized During Treatment: Rolling walker (2 wheels);Oxygen Nurse Communication: Mobility status  Activity Tolerance: Patient tolerated treatment well Patient left: in chair;with call bell/phone within reach;with family/visitor present  OT Visit Diagnosis: Other abnormalities of gait and mobility (R26.89);Muscle weakness (generalized) (M62.81)                Time: 1610-9604 OT Time Calculation (min): 41 min Charges:  OT General Charges $OT Visit: 1 Visit OT Evaluation $OT Eval Moderate Complexity: 1 Mod OT Treatments $Self Care/Home Management : 8-22 mins $Therapeutic Activity: 8-22 mins Jerrico Covello, OTR/L 01/26/24, 1:23 PM  Dvonte Gatliff E Lorilyn Laitinen 01/26/2024, 1:18 PM

## 2024-01-26 NOTE — Evaluation (Addendum)
 Physical Therapy Evaluation Patient Details Name: Jessica Leonard MRN: [id number] DOB: Sep 15, 1940 Today's Date: 01/26/2024  History of Present Illness  Pt is an 83 y.o. female who presented to the ER with a complaint of worsening dyspnea with associated cough and wheezing over the last week. Seen by her PCP on 6/12 and diagnosed with bronchitis. Admitted for management of sepsis d/t pneumonia, acute respiratory failure with hypoxia. PMH of anxiety, depression, chronic back pain, essential hypertension, and dyslipidemia.  Clinical Impression  Pt was pleasant and motivated to participate during the session and put forth good effort throughout. Pt required CGA for STS from chair and ambulation with RW. A chair follow was used during ambulation for pt safety. Pt reported pain/fatigue in low back and arms after ambulating 60 feet and declined any further gait activities. Pt reported no adverse symptoms during the session with SpO2 and HR WNL throughout on  6L/min. Pt will benefit from continued PT services upon discharge to safely address deficits listed in patient problem list for decreased caregiver assistance and eventual return to PLOF.        If plan is discharge home, recommend the following: A little help with walking and/or transfers;Assistance with cooking/housework;Assist for transportation;Help with stairs or ramp for entrance;A little help with bathing/dressing/bathroom   Can travel by private vehicle        Equipment Recommendations None recommended by PT  Recommendations for Other Services       Functional Status Assessment Patient has had a recent decline in their functional status and demonstrates the ability to make significant improvements in function in a reasonable and predictable amount of time.     Precautions / Restrictions Precautions Precautions: Fall Recall of Precautions/Restrictions: Intact Precaution/Restrictions Comments: Monitor SPO2% Restrictions Weight  Bearing Restrictions Per Provider Order: No      Mobility  Bed Mobility               General bed mobility comments: Not assessed; pt received in chair    Transfers Overall transfer level: Needs assistance Equipment used: Rolling walker (2 wheels) Transfers: Sit to/from Stand Sit to Stand: Contact guard assist           General transfer comment: Pt performed STS from chair with RW with CGA, pt remained steady throughout    Ambulation/Gait Ambulation/Gait assistance: Contact guard assist, +2 safety/equipment Gait Distance (Feet): 60 Feet Assistive device: Rolling walker (2 wheels) Gait Pattern/deviations: Step-through pattern, Trunk flexed, Decreased step length - right, Decreased step length - left, Decreased stride length, Knee flexed in stance - left, Knee flexed in stance - right Gait velocity: decreased     General Gait Details: Pt demonstrated severely flexed trunk and began leaning on RW with bilateral forearms after 40 feet due to chronic low back pain, which pt stated this is typically what she does when ambulating at home. Pt remained steady throughout with no LOBs observed. A chair follow was utilized for pt Wellsite geologist     Tilt Bed    Modified Rankin (Stroke Patients Only)       Balance Overall balance assessment: Needs assistance Sitting-balance support: Feet supported Sitting balance-Leahy Scale: Good Sitting balance - Comments: Pt able to maintain sitting balance in chair with no physical assistance or holding onto armrests   Standing balance support: Reliant on assistive device for balance, During functional activity, Bilateral upper extremity supported Standing balance-Leahy Scale: Fair Standing balance  comment: Pt heavily reliant on RW to maintain balance during stance and ambulation, but remained steady throughout                             Pertinent Vitals/Pain Pain Assessment Pain  Assessment: 0-10 Pain Score: 8  Pain Location: Low back Pain Descriptors / Indicators: Aching, Tightness, Sore Pain Intervention(s): Monitored during session, Limited activity within patient's tolerance    Home Living Family/patient expects to be discharged to:: Private residence Living Arrangements: Personal caregiver Available Help at Discharge: Personal caregiver;Available 24 hours/day Type of Home: House Home Access: Ramped entrance       Home Layout: One level Home Equipment: Educational psychologist (4 wheels);Wheelchair - manual;Electric scooter;Cane - single point;Grab bars - tub/shower;Hand held shower head;Educational psychologist (2 wheels)      Prior Function Prior Level of Function : Needs assist;History of Falls (last six months)       Physical Assist : Mobility (physical) Mobility (physical): Gait   Mobility Comments: Pt reported using rollator for household ambulation and electric scooter in community ADLs Comments: Pt reported that her caregiver that she lives with prepares meals, but pt is able to to independently dress and bathe self     Extremity/Trunk Assessment   Upper Extremity Assessment Upper Extremity Assessment: Overall WFL for tasks assessed    Lower Extremity Assessment Lower Extremity Assessment: Overall WFL for tasks assessed       Communication   Communication Communication: No apparent difficulties    Cognition Arousal: Alert Behavior During Therapy: WFL for tasks assessed/performed   PT - Cognitive impairments: No apparent impairments                         Following commands: Intact       Cueing Cueing Techniques: Verbal cues, Gestural cues     General Comments General comments (skin integrity, edema, etc.): On 6L/min throughout entirety of session, pt SPO2% was 93% in sitting. After ambulating 30 feet, pt SPO2% was 91%. Then, after ambulating another 30 feet, pt SPO2% was 91%. Minimal SOB with effort, pt able to  maintain conversation during ambulation.      Exercises     Assessment/Plan    PT Assessment Patient needs continued PT services  PT Problem List Decreased strength;Decreased balance;Pain;Decreased range of motion;Decreased mobility;Cardiopulmonary status limiting activity;Decreased activity tolerance       PT Treatment Interventions DME instruction;Therapeutic activities;Gait training;Therapeutic exercise;Patient/family education;Balance training;Functional mobility training    PT Goals (Current goals can be found in the Care Plan section)  Acute Rehab PT Goals Patient Stated Goal: to be able to complete ADLs without SOB PT Goal Formulation: With patient Time For Goal Achievement: 02/08/24 Potential to Achieve Goals: Good    Frequency Min 2X/week     Co-evaluation               AM-PAC PT 6 Clicks Mobility  Outcome Measure Help needed turning from your back to your side while in a flat bed without using bedrails?: None Help needed moving from lying on your back to sitting on the side of a flat bed without using bedrails?: None Help needed moving to and from a bed to a chair (including a wheelchair)?: A Little Help needed standing up from a chair using your arms (e.g., wheelchair or bedside chair)?: A Little Help needed to walk in hospital room?: A Little Help needed climbing 3-5 steps with  a railing? : A Lot 6 Click Score: 19    End of Session Equipment Utilized During Treatment: Gait belt Activity Tolerance: Patient limited by fatigue;Treatment limited secondary to medical complications (Comment) (Treatment limited due to pt reporting SOB) Patient left: in chair;with call bell/phone within reach;with family/visitor present Nurse Communication: Mobility status PT Visit Diagnosis: Unsteadiness on feet (R26.81);Other abnormalities of gait and mobility (R26.89);Repeated falls (R29.6);Pain Pain - Right/Left:  (low back)    Time: 8295-6213 PT Time Calculation (min)  (ACUTE ONLY): 30 min   Charges:                 Rhoda Centers, SPT 01/26/24, 3:02 PM This entire session was performed under direct supervision and direction of a licensed therapist/therapist assistant. I have personally read, edited and approve of the note as written.  Lonn Roads, DPT

## 2024-01-26 NOTE — ED Notes (Signed)
 Pt is a high fall risk, pt has entire fall bundle in place at this time with fall risk bracelet, non skid socks, and bed alarm.

## 2024-01-26 NOTE — ED Notes (Signed)
 Pt currently on 13L HFNC, O2 sats 94%, pt was decreased to 11L HFNC and remained 92 %, pt was then weaned again to 5L and remained above 90%, after breathing treatment pt O2 sats dropped back to 84% and was placed back on HFNC at 10L and O2 sats are 93%

## 2024-01-26 NOTE — Progress Notes (Signed)
 Progress Note   Patient: Jessica Leonard MWU:[id number] DOB: 03-22-1941 DOA: 01/24/2024     1 DOS: the patient was seen and examined on 01/26/2024     Brief hospital course:   Jessica Leonard is a 83 y.o. female with medical history significant for anxiety, depression, chronic back pain, essential hypertension, and dyslipidemia who presented to the emergency room, presented to the emergency room with a calcium  of worsening dyspnea with associated cough and wheezing over the last week.  The patient admitted to chills but denies any measured fever.  No nausea or vomiting or abdominal pain.  No dysuria, oliguria or hematuria or flank pain.  No chest pain or palpitations.  She was found to be hypoxic by EMS with a pulse oximetry of 75% on room air.  She does not wear oxygen at home.  She was seen by her PCP on 6/12 and diagnosed with bronchitis.  She was placed on doxycycline and prednisone.  She was initially in the ER on nonrebreather.   ED Course: When she came to the ER, BP was 175/96 with temperature 97.4 axillary and respiratory to 25 and pulse oximetry 75% on room air and 94% on high flow nasal cannula 11L/min.  Heart rate later on was 92.  Labs revealed hypokalemia 3.2 blood glucose 136 with total protein of 6.3 and total bili of 1.3.  Lactic acid was 2.5 and procalcitonin less than 0.1.  High-sensitivity troponin was 7.  CBC showed hemoconcentration.  INR is 1 and PT 13.7.  UA showed 6-10 WBCs with no bacteria and negative nitrite. EKG as reviewed by me : EKG showed sinus rhythm with rate of 87 with left atrial enlargement, right bundle branch block and left intrafascicular block. Imaging: Forward chest x-ray showed low lung volumes and mild left basal atelectasis.  Chest abdomen pelvic CT scan with contrast revealed the following: 1. Mild posterior right lower lobe opacity, favoring mild infection/pneumonia over atelectasis/scarring. 2. 4 mm calculus in the left proximal collecting  system/ureter. No hydronephrosis. 3. Additional ancillary findings as above.         Assessment and Plan: Sepsis due to pneumonia (HCC) - Sepsis is manifested by tachycardia and tachypnea. Continue telemetry Continue intranasal oxygen Continue ceftriaxone and azithromycin We will monitor respiratory function closely CODE STATUS discussed with patient and she agreed to be full code if needed to be on ventilator     Acute respiratory failure with hypoxia (HCC) - Secondary to community-acquired pneumonia - Management as above. - O2 protocol will be followed.   GERD without esophagitis Continue PPI therapy.   Anxiety and depression - Will continue Cymbalta  and Tofranil    Dyslipidemia - Will continue statin therapy.   Essential hypertension - Continue antihypertensive therapy.     DVT prophylaxis: Lovenox .    Advanced Care Planning:  Code Status: full code.    Disposition Plan: Back to previous home environment   Subjective:  Patient seen and examined at bedside this morning Oxygen requirement has improved from 13 L yesterday to 6 L today Admits to improvement in respiratory function Denies nausea vomiting abdominal pain or chest pain  Physical Exam:   GENERAL: Elderly female laying in bed in no acute distress EYES: Pupils equal, round, reactive to light and accommodation. No scleral icterus. Extraocular muscles intact.  HEENT: Head atraumatic, normocephalic. Oropharynx and nasopharynx clear.  NECK:  Supple, no jugular venous distention. No thyroid enlargement, no tenderness.  LUNGS: Decreased air entry bibasilarly CARDIOVASCULAR: Regular rate and rhythm, S1,  S2 normal. No murmurs, rubs, or gallops.  ABDOMEN: Soft, nondistended, nontender. Bowel sounds present. No organomegaly or mass.  EXTREMITIES: No pedal edema, cyanosis, or clubbing.  NEUROLOGIC: Cranial nerves II through XII are intact. Muscle strength 5/5 in all extremities. Sensation intact. Gait not  checked.  PSYCHIATRIC: The patient is alert and oriented x 3.  Normal affect and good eye contact. SKIN: No obvious rash, lesion, or ulcer.    Family Communication: None at bedside   Disposition: Status is: Inpatient    Planned Discharge Destination:  Time spent: 43 minutes  Data Reviewed: CT scan showing findings of right lower lung infiltrate   Vitals:   01/26/24 1124 01/26/24 1200 01/26/24 1627 01/26/24 1629  BP:  98/77 (!) 141/68   Pulse:  82 85   Resp:  11    Temp: (!) 97.5 F (36.4 C)  98.7 F (37.1 C)   TempSrc: Oral     SpO2:  92% 96% 90%  Weight:      Height:          Latest Ref Rng & Units 01/26/2024    6:01 AM 01/25/2024   12:14 AM 01/15/2020    4:28 AM  CBC  WBC 4.0 - 10.5 K/uL 8.5  9.8  10.5   Hemoglobin 12.0 - 15.0 g/dL 21.3  08.6  57.8   Hematocrit 36.0 - 46.0 % 44.5  47.3  43.7   Platelets 150 - 400 K/uL 204  231  186        Latest Ref Rng & Units 01/26/2024    6:01 AM 01/25/2024    4:18 AM 01/25/2024   12:14 AM  BMP  Glucose 70 - 99 mg/dL 89  469  629   BUN 8 - 23 mg/dL 17  17  19    Creatinine 0.44 - 1.00 mg/dL 5.28  4.13  2.44   Sodium 135 - 145 mmol/L 141  139  141   Potassium 3.5 - 5.1 mmol/L 3.7  4.0  3.2   Chloride 98 - 111 mmol/L 103  104  105   CO2 22 - 32 mmol/L 29  25  24    Calcium  8.9 - 10.3 mg/dL 8.8  9.6  01.0      Author: Ezzard Holms, MD 01/26/2024 4:34 PM  For on call review [email or web address].

## 2024-01-27 ENCOUNTER — Inpatient Hospital Stay

## 2024-01-27 DIAGNOSIS — A419 Sepsis, unspecified organism: Secondary | ICD-10-CM | POA: Diagnosis not present

## 2024-01-27 DIAGNOSIS — J189 Pneumonia, unspecified organism: Secondary | ICD-10-CM | POA: Diagnosis not present

## 2024-01-27 LAB — CBC WITH DIFFERENTIAL/PLATELET
Abs Immature Granulocytes: 0.03 10*3/uL (ref 0.00–0.07)
Basophils Absolute: 0 10*3/uL (ref 0.0–0.1)
Basophils Relative: 0 %
Eosinophils Absolute: 0.3 10*3/uL (ref 0.0–0.5)
Eosinophils Relative: 3 %
HCT: 42.8 % (ref 36.0–46.0)
Hemoglobin: 14 g/dL (ref 12.0–15.0)
Immature Granulocytes: 0 %
Lymphocytes Relative: 26 %
Lymphs Abs: 2.4 10*3/uL (ref 0.7–4.0)
MCH: 31.2 pg (ref 26.0–34.0)
MCHC: 32.7 g/dL (ref 30.0–36.0)
MCV: 95.3 fL (ref 80.0–100.0)
Monocytes Absolute: 0.9 10*3/uL (ref 0.1–1.0)
Monocytes Relative: 10 %
Neutro Abs: 5.4 10*3/uL (ref 1.7–7.7)
Neutrophils Relative %: 61 %
Platelets: 201 10*3/uL (ref 150–400)
RBC: 4.49 MIL/uL (ref 3.87–5.11)
RDW: 13.5 % (ref 11.5–15.5)
WBC: 9 10*3/uL (ref 4.0–10.5)
nRBC: 0 % (ref 0.0–0.2)

## 2024-01-27 LAB — BASIC METABOLIC PANEL WITH GFR
Anion gap: 8 (ref 5–15)
BUN: 16 mg/dL (ref 8–23)
CO2: 30 mmol/L (ref 22–32)
Calcium: 9 mg/dL (ref 8.9–10.3)
Chloride: 104 mmol/L (ref 98–111)
Creatinine, Ser: 0.59 mg/dL (ref 0.44–1.00)
GFR, Estimated: >60 mL/min (ref >60)
Glucose, Bld: 99 mg/dL (ref 70–99)
Potassium: 3.8 mmol/L (ref 3.5–5.1)
Sodium: 142 mmol/L (ref 135–145)

## 2024-01-27 MED ORDER — ENOXAPARIN SODIUM 60 MG/0.6ML IJ SOSY
50.0000 mg | PREFILLED_SYRINGE | INTRAMUSCULAR | Status: DC
  Administered 2024-01-28 – 2024-02-03 (×7): 50 mg via SUBCUTANEOUS
  Filled 2024-01-27 (×7): qty 0.6

## 2024-01-27 NOTE — Progress Notes (Signed)
 PHARMACIST - PHYSICIAN COMMUNICATION  CONCERNING:  Enoxaparin  (Lovenox ) for DVT Prophylaxis   RECOMMENDATION: Patient was prescribed enoxaprin 40mg  q24 hours for VTE prophylaxis.   Filed Weights   01/25/24 0101  Weight: 94.3 kg (208 lb)   Body mass index is 38.04 kg/m.  Based on [facility] policy patient is candidate for enoxaparin  0.5mg /kg TBW SQ every 24 hours based on BMI being >30.  DESCRIPTION: Pharmacy has adjusted enoxaparin  dose per [facility] policy.  Patient is now receiving enoxaparin  50 mg every 24 hours   Aadit Hagood, PharmD Pharmacy Resident  01/27/2024 11:03 AM

## 2024-01-27 NOTE — Plan of Care (Signed)
  Problem: Clinical Measurements: Goal: Diagnostic test results will improve Outcome: Progressing   Problem: Education: Goal: Knowledge of General Education information will improve Description: Including pain rating scale, medication(s)/side effects and non-pharmacologic comfort measures Outcome: Progressing   Problem: Clinical Measurements: Goal: Respiratory complications will improve Outcome: Progressing   Problem: Elimination: Goal: Will not experience complications related to bowel motility Outcome: Progressing

## 2024-01-27 NOTE — Progress Notes (Signed)
 Physical Therapy Treatment Patient Details Name: Jessica Leonard MRN: [id number] DOB: 07/29/1941 Today's Date: 01/27/2024   History of Present Illness Pt is an 83 y.o. female who presented to the ER with a calcium  of worsening dyspnea with associated cough and wheezing over the last week. Seen by her PCP on 6/12 and diagnosed with bronchitis. Admitted for management of sepsis d/t pneumonia, acute respiratory failure with hypoxia. PMH of anxiety, depression, chronic back pain, essential hypertension, and dyslipidemia    PT Comments  Pt was pleasant and motivated to participate during the session and put forth good effort throughout. Pt found on 11LO2/min with SpO2 86-87%.  Pt required min A for bed mobility tasks this session and after coming to sitting at the EOB SpO2 found to be 83-84%.  Cues given for PLB with SpO2 occasionally increasing to 85-86% but would quickly revert back to 83-84%.  Nursing notified and entered the room. Nursing increased O2 gradually from 11L up to 15L during the session with intermittent PLB but with SpO2 remaining <88%.  Nursing requested pt not be transferred to the chair this session due to low SpO2 on increased O2 and pt assisted back to supine.  Pt will benefit from continued PT services upon discharge to safely address deficits listed in patient problem list for decreased caregiver assistance and eventual return to PLOF.      If plan is discharge home, recommend the following: A little help with walking and/or transfers;Assistance with cooking/housework;Assist for transportation;Help with stairs or ramp for entrance;A little help with bathing/dressing/bathroom   Can travel by private vehicle        Equipment Recommendations  None recommended by PT    Recommendations for Other Services       Precautions / Restrictions Precautions Precautions: Fall Recall of Precautions/Restrictions: Intact Precaution/Restrictions Comments: Monitor  SPO2% Restrictions Weight Bearing Restrictions Per Provider Order: No     Mobility  Bed Mobility Overal bed mobility: Needs Assistance Bed Mobility: Supine to Sit, Sit to Supine     Supine to sit: Min assist Sit to supine: Min assist   General bed mobility comments: Min A for BLE and trunk control    Transfers Overall transfer level: Needs assistance   Transfers: Bed to chair/wheelchair/BSC            Lateral/Scoot Transfers: Supervision General transfer comment: Pt able to perform several small lateral scoots to better position herself  at the EOB prior to returning to supine with SBA only    Ambulation/Gait               General Gait Details: deferred per nursing request due to low SpO2   Stairs             Wheelchair Mobility     Tilt Bed    Modified Rankin (Stroke Patients Only)       Balance Overall balance assessment: Needs assistance Sitting-balance support: Feet supported, Single extremity supported Sitting balance-Leahy Scale: Good                                      Communication Communication Communication: No apparent difficulties  Cognition Arousal: Alert Behavior During Therapy: WFL for tasks assessed/performed   PT - Cognitive impairments: No apparent impairments                         Following commands: Intact  Cueing Cueing Techniques: Verbal cues, Gestural cues  Exercises Other Exercises Other Exercises: Static sitting at EOB x 15 min to assess SpO2 response to positioning/light exertion with intermittent cuing/education for PLB    General Comments        Pertinent Vitals/Pain Pain Assessment Pain Assessment: 0-10 Pain Score: 7  Pain Location: Low back and LEs Pain Descriptors / Indicators: Aching, Sore Pain Intervention(s): Monitored during session, Patient requesting pain meds-RN notified, Repositioned    Home Living                          Prior Function             PT Goals (current goals can now be found in the care plan section) Progress towards PT goals: PT to reassess next treatment    Frequency    Min 2X/week      PT Plan      Co-evaluation              AM-PAC PT 6 Clicks Mobility   Outcome Measure  Help needed turning from your back to your side while in a flat bed without using bedrails?: A Little Help needed moving from lying on your back to sitting on the side of a flat bed without using bedrails?: A Little Help needed moving to and from a bed to a chair (including a wheelchair)?: A Little Help needed standing up from a chair using your arms (e.g., wheelchair or bedside chair)?: A Little Help needed to walk in hospital room?: A Little Help needed climbing 3-5 steps with a railing? : A Lot 6 Click Score: 17    End of Session Equipment Utilized During Treatment: Oxygen Activity Tolerance: Other (comment) (Pt limited by low SpO2) Patient left: in bed;with call bell/phone within reach;with bed alarm set;with nursing/sitter in room Nurse Communication: Mobility status;Other (comment) (Nursing notified of low SpO2 per above and in room for much of the session) PT Visit Diagnosis: Unsteadiness on feet (R26.81);Other abnormalities of gait and mobility (R26.89);Repeated falls (R29.6);Pain Pain - Right/Left:  (low back and BLEs)     Time: 1610-9604 PT Time Calculation (min) (ACUTE ONLY): 26 min  Charges:    $Therapeutic Activity: 23-37 mins PT General Charges $$ ACUTE PT VISIT: 1 Visit                     D. Scott Kegan Shepardson PT, DPT 01/27/24, 10:33 AM

## 2024-01-27 NOTE — Progress Notes (Signed)
 Progress Note   Patient: Jessica Leonard ZOX:[id number] DOB: 1941-01-03 DOA: 01/24/2024     2 DOS: the patient was seen and examined on 01/27/2024     Brief hospital course:   Jessica Leonard is a 83 y.o. female with medical history significant for anxiety, depression, chronic back pain, essential hypertension, and dyslipidemia who presented to the emergency room, presented to the emergency room with a calcium  of worsening dyspnea with associated cough and wheezing over the last week.  The patient admitted to chills but denies any measured fever.  No nausea or vomiting or abdominal pain.  No dysuria, oliguria or hematuria or flank pain.  No chest pain or palpitations.  She was found to be hypoxic by EMS with a pulse oximetry of 75% on room air.  She does not wear oxygen at home.  She was seen by her PCP on 6/12 and diagnosed with bronchitis.  She was placed on doxycycline and prednisone.  She was initially in the ER on nonrebreather.   ED Course: When she came to the ER, BP was 175/96 with temperature 97.4 axillary and respiratory to 25 and pulse oximetry 75% on room air and 94% on high flow nasal cannula 11L/min.  Heart rate later on was 92.  Labs revealed hypokalemia 3.2 blood glucose 136 with total protein of 6.3 and total bili of 1.3.  Lactic acid was 2.5 and procalcitonin less than 0.1.  High-sensitivity troponin was 7.  CBC showed hemoconcentration.  INR is 1 and PT 13.7.  UA showed 6-10 WBCs with no bacteria and negative nitrite. EKG as reviewed by me : EKG showed sinus rhythm with rate of 87 with left atrial enlargement, right bundle branch block and left intrafascicular block. Imaging: Forward chest x-ray showed low lung volumes and mild left basal atelectasis.  Chest abdomen pelvic CT scan with contrast revealed the following: 1. Mild posterior right lower lobe opacity, favoring mild infection/pneumonia over atelectasis/scarring. 2. 4 mm calculus in the left proximal collecting  system/ureter. No hydronephrosis. 3. Additional ancillary findings as above.         Assessment and Plan: Sepsis due to pneumonia (HCC) - Sepsis is manifested by tachycardia and tachypnea. Continue telemetry Continue intranasal oxygen Continue ceftriaxone and azithromycin We will monitor respiratory function closely CODE STATUS discussed with patient and she agreed to be full code if needed to be on ventilator I have consulted pulmonologist and case discussed given worsening respiratory function   Acute respiratory failure with hypoxia (HCC) - Secondary to community-acquired pneumonia - Management as above. - O2 protocol will be followed.   GERD without esophagitis Continue PPI therapy.   Anxiety and depression - Will continue Cymbalta  and Tofranil    Dyslipidemia - Will continue statin therapy.   Essential hypertension - Continue antihypertensive therapy.     DVT prophylaxis: Lovenox .    Advanced Care Planning:  Code Status: full code.    Disposition Plan: Back to previous home environment   Subjective:  Patient's oxygen requirement has worsened to 15 L today Chest x-ray obtained that did not show any significant change Pulmonologist consulted   Physical Exam:   GENERAL: Elderly female laying in bed in no acute distress EYES: Pupils equal, round, reactive to light and accommodation. No scleral icterus. Extraocular muscles intact.  HEENT: Head atraumatic, normocephalic. Oropharynx and nasopharynx clear.  NECK:  Supple, no jugular venous distention. No thyroid enlargement, no tenderness.  LUNGS: Decreased air entry bibasilarly CARDIOVASCULAR: Regular rate and rhythm, S1, S2 normal. No  murmurs, rubs, or gallops.  ABDOMEN: Soft, nondistended, nontender. Bowel sounds present. No organomegaly or mass.  EXTREMITIES: No pedal edema, cyanosis, or clubbing.  NEUROLOGIC: Cranial nerves II through XII are intact. Muscle strength 5/5 in all extremities. Sensation intact.  Gait not checked.  PSYCHIATRIC: The patient is alert and oriented x 3.  Normal affect and good eye contact. SKIN: No obvious rash, lesion, or ulcer.    Family Communication: None at bedside   Disposition: Status is: Inpatient    Planned Discharge Destination:  Time spent: 45 minutes   Data Reviewed:  This x-ray obtained today reviewed that shows some linear opacity in the right base   Vitals:   01/27/24 0755 01/27/24 0932 01/27/24 1206 01/27/24 1548  BP: (!) 162/71  (!) 159/87   Pulse: 79  82 81  Resp: (!) 22  15   Temp: 98.3 F (36.8 C)  98.1 F (36.7 C) 98.1 F (36.7 C)  TempSrc:      SpO2: 90% (!) 88% 90% 90%  Weight:      Height:          Latest Ref Rng & Units 01/27/2024    5:48 AM 01/26/2024    6:01 AM 01/25/2024   12:14 AM  CBC  WBC 4.0 - 10.5 K/uL 9.0  8.5  9.8   Hemoglobin 12.0 - 15.0 g/dL 16.1  09.6  04.5   Hematocrit 36.0 - 46.0 % 42.8  44.5  47.3   Platelets 150 - 400 K/uL 201  204  231        Latest Ref Rng & Units 01/27/2024    5:48 AM 01/26/2024    6:01 AM 01/25/2024    4:18 AM  BMP  Glucose 70 - 99 mg/dL 99  89  409   BUN 8 - 23 mg/dL 16  17  17    Creatinine 0.44 - 1.00 mg/dL 8.11  9.14  7.82   Sodium 135 - 145 mmol/L 142  141  139   Potassium 3.5 - 5.1 mmol/L 3.8  3.7  4.0   Chloride 98 - 111 mmol/L 104  103  104   CO2 22 - 32 mmol/L 30  29  25    Calcium  8.9 - 10.3 mg/dL 9.0  8.8  9.6      Author: Ezzard Holms, MD 01/27/2024 4:43 PM  For on call review [email or web address].

## 2024-01-27 NOTE — Progress Notes (Signed)
 Occupational Therapy Treatment Patient Details Name: Jessica Leonard MRN: [id number] DOB: [birth year] Today's Date: 01/27/2024   History of present illness Pt is an 83 y.o. female who presented to the ER with a calcium  of worsening dyspnea with associated cough and wheezing over the last week. Seen by her PCP on 6/12 and diagnosed with bronchitis. Admitted for management of sepsis d/t pneumonia, acute respiratory failure with hypoxia. PMH of anxiety, depression, chronic back pain, essential hypertension, and dyslipidemia   OT comments  OT stopped in hall by pt's caregiver asking if she could get up to chair d/t back pain in bed and needing repositioning. This Bolivar Bushman enters the room and assess sp02 which is at 86% on 15L initially, but improved to 88% after education on pursed lip breathing. OT then edu pt/caregiver on high 02 needs preventing further mobility at this time. Pt and caregiver verbalize understanding. Due to pt's back pain with positioning in bed, this author did assist with repositioning pt. Pt able to long sit with MOD I and scoot hips backward towards HOB, however ultimately needed Mod A x2 using chux pad to scoot her up to [facility] completely. Sp02 88% on 15L on OT exit. Pt will continue to benefit from skilled OT services to maximize her endurance and strength to return home safely, however will be limited by her 02 needs at this time.      If plan is discharge home, recommend the following:  A little help with walking and/or transfers;A little help with bathing/dressing/bathroom   Equipment Recommendations  Other (comment) (oxygen)    Recommendations for Other Services      Precautions / Restrictions Precautions Precautions: Fall Recall of Precautions/Restrictions: Intact Precaution/Restrictions Comments: Monitor SPO2% Restrictions Weight Bearing Restrictions Per Provider Order: No       Mobility Bed Mobility Overal bed mobility: Needs Assistance              General bed mobility comments: pt able to long sit in bed with MOD I and attempt scooting up towards [facility], however ultimately requires Mod A x2 to reposition comfortably with sp02 of 88% on 15L    Transfers                   General transfer comment: deferred     Balance                                           ADL either performed or assessed with clinical judgement   ADL Overall ADL's : Needs assistance/impaired                                       General ADL Comments: limited session, assisted with repositioniong pt and pt/caregiver education on PLB and limited activity d/t high 02 needs    Extremity/Trunk Assessment              Vision       Perception     Praxis     Communication Communication Communication: No apparent difficulties   Cognition Arousal: Alert Behavior During Therapy: [facility] [place] for tasks assessed/performed                                 Following commands:  Intact        Cueing   Cueing Techniques: Verbal cues, Gestural cues  Exercises Other Exercises Other Exercises: OT stopped in hall by pt caregiver asking if she could get up to chair d/t back pain in bed and needing repositioning; OT assessed sp02 and edu pt/caregiver on high 02 needs preventing further mobility at this time Other Exercises: Further education on PLB techniques to maximize sp02, pt at 88% on 15L throughout session    Shoulder Instructions       General Comments OT stopped in hall by pt caregiver asking if she could get up to chair d/t back pain in bed and needing repositioning; OT assessed sp02 and edu pt/caregiver on high 02 needs preventing further mobility at this time    Pertinent Vitals/ Pain       Pain Assessment Pain Assessment: Faces Faces Pain Scale: Hurts even more Pain Location: lower back Pain Descriptors / Indicators: Aching, Sore Pain Intervention(s): Monitored during session,  Repositioned  Home Living                                          Prior Functioning/Environment              Frequency  Min 2X/week        Progress Toward Goals  OT Goals(current goals can now be found in the care plan section)  Progress towards OT goals: Progressing toward goals  Acute Rehab OT Goals Patient Stated Goal: go home OT Goal Formulation: With patient/family Time For Goal Achievement: 02/09/24 Potential to Achieve Goals: Good  Plan      Co-evaluation                 AM-PAC OT 6 Clicks Daily Activity     Outcome Measure   Help from another person eating meals?: None Help from another person taking care of personal grooming?: None Help from another person toileting, which includes using toliet, bedpan, or urinal?: A Little Help from another person bathing (including washing, rinsing, drying)?: A Little Help from another person to put on and taking off regular upper body clothing?: A Little Help from another person to put on and taking off regular lower body clothing?: A Little 6 Click Score: 20    End of Session Equipment Utilized During Treatment: Oxygen  OT Visit Diagnosis: Other abnormalities of gait and mobility (R26.89);Muscle weakness (generalized) (M62.81)   Activity Tolerance Treatment limited secondary to medical complications (Comment) (15L HFNC at 88% sp02)   Patient Left in bed;with call bell/phone within reach;with bed alarm set;with family/visitor present   Nurse Communication Mobility status        Time: 5784-6962 OT Time Calculation (min): 10 min  Charges: OT General Charges $OT Visit: 1 Visit OT Treatments $Therapeutic Activity: 8-22 mins  Yasaman Kolek, OTR/L  01/27/24, 3:56 PM   Itzabella Sorrels E Mai Longnecker 01/27/2024, 3:53 PM

## 2024-01-28 DIAGNOSIS — J189 Pneumonia, unspecified organism: Secondary | ICD-10-CM | POA: Diagnosis not present

## 2024-01-28 DIAGNOSIS — A419 Sepsis, unspecified organism: Secondary | ICD-10-CM | POA: Diagnosis not present

## 2024-01-28 LAB — BASIC METABOLIC PANEL WITH GFR
Anion gap: 7 (ref 5–15)
BUN: 12 mg/dL (ref 8–23)
CO2: 31 mmol/L (ref 22–32)
Calcium: 9 mg/dL (ref 8.9–10.3)
Chloride: 104 mmol/L (ref 98–111)
Creatinine, Ser: 0.53 mg/dL (ref 0.44–1.00)
GFR, Estimated: >60 mL/min (ref >60)
Glucose, Bld: 96 mg/dL (ref 70–99)
Potassium: 4 mmol/L (ref 3.5–5.1)
Sodium: 142 mmol/L (ref 135–145)

## 2024-01-28 LAB — CBC WITH DIFFERENTIAL/PLATELET
Abs Immature Granulocytes: 0.03 10*3/uL (ref 0.00–0.07)
Basophils Absolute: 0 10*3/uL (ref 0.0–0.1)
Basophils Relative: 0 %
Eosinophils Absolute: 0.3 10*3/uL (ref 0.0–0.5)
Eosinophils Relative: 3 %
HCT: 45.2 % (ref 36.0–46.0)
Hemoglobin: 14.5 g/dL (ref 12.0–15.0)
Immature Granulocytes: 0 %
Lymphocytes Relative: 23 %
Lymphs Abs: 1.9 10*3/uL (ref 0.7–4.0)
MCH: 31.2 pg (ref 26.0–34.0)
MCHC: 32.1 g/dL (ref 30.0–36.0)
MCV: 97.2 fL (ref 80.0–100.0)
Monocytes Absolute: 0.9 10*3/uL (ref 0.1–1.0)
Monocytes Relative: 11 %
Neutro Abs: 5.1 10*3/uL (ref 1.7–7.7)
Neutrophils Relative %: 63 %
Platelets: 178 10*3/uL (ref 150–400)
RBC: 4.65 MIL/uL (ref 3.87–5.11)
RDW: 13.6 % (ref 11.5–15.5)
WBC: 8.2 10*3/uL (ref 4.0–10.5)
nRBC: 0 % (ref 0.0–0.2)

## 2024-01-28 LAB — C-REACTIVE PROTEIN: CRP: 3.6 mg/dL — ABNORMAL HIGH (ref <1.0)

## 2024-01-28 MED ORDER — POLYVINYL ALCOHOL 1.4 % OP SOLN: 1.0000 [drp] | OPHTHALMIC | Status: DC | PRN

## 2024-01-28 MED ORDER — NIFEDIPINE ER 60 MG PO TB24
60.0000 mg | ORAL_TABLET | Freq: Every day | ORAL | Status: DC
  Administered 2024-01-28 – 2024-02-03 (×7): 60 mg via ORAL
  Filled 2024-01-28 (×7): qty 1

## 2024-01-28 MED ORDER — ATORVASTATIN CALCIUM 20 MG PO TABS
20.0000 mg | ORAL_TABLET | Freq: Every day | ORAL | Status: DC
  Administered 2024-01-28 – 2024-02-02 (×6): 20 mg via ORAL
  Filled 2024-01-28 (×6): qty 1

## 2024-01-28 MED ORDER — BISACODYL 10 MG RE SUPP
10.0000 mg | Freq: Once | RECTAL | Status: AC
  Administered 2024-01-28: 10 mg via RECTAL
  Filled 2024-01-28: qty 1

## 2024-01-28 MED ORDER — OCUVITE-LUTEIN PO CAPS
1.0000 | ORAL_CAPSULE | Freq: Two times a day (BID) | ORAL | Status: DC
  Filled 2024-01-28: qty 1

## 2024-01-28 MED ORDER — PROSIGHT PO TABS
1.0000 | ORAL_TABLET | Freq: Two times a day (BID) | ORAL | Status: DC
  Administered 2024-01-28 – 2024-02-03 (×13): 1 via ORAL
  Filled 2024-01-28 (×14): qty 1

## 2024-01-28 MED ORDER — OYSTER SHELL CALCIUM/D3 500-5 MG-MCG PO TABS
1.0000 | ORAL_TABLET | Freq: Two times a day (BID) | ORAL | Status: DC
  Administered 2024-01-28 – 2024-02-03 (×13): 1 via ORAL
  Filled 2024-01-28 (×13): qty 1

## 2024-01-28 MED ORDER — DULOXETINE HCL 30 MG PO CPEP
60.0000 mg | ORAL_CAPSULE | Freq: Every day | ORAL | Status: DC
  Administered 2024-01-28 – 2024-02-03 (×7): 60 mg via ORAL
  Filled 2024-01-28 (×7): qty 2

## 2024-01-28 MED ORDER — VITAMIN D 25 MCG (1000 UNIT) PO TABS
1000.0000 [IU] | ORAL_TABLET | Freq: Every day | ORAL | Status: DC
  Administered 2024-01-28 – 2024-02-03 (×7): 1000 [IU] via ORAL
  Filled 2024-01-28 (×7): qty 1

## 2024-01-28 MED ORDER — LACTULOSE 10 GM/15ML PO SOLN
20.0000 g | Freq: Every day | ORAL | Status: DC
  Administered 2024-02-01: 20 g via ORAL
  Filled 2024-01-28 (×6): qty 30

## 2024-01-28 MED ORDER — FLUTICASONE PROPIONATE 50 MCG/ACT NA SUSP
1.0000 | Freq: Every day | NASAL | Status: DC
  Administered 2024-01-28 – 2024-02-03 (×7): 1 via NASAL
  Filled 2024-01-28: qty 16

## 2024-01-28 MED ORDER — IPRATROPIUM-ALBUTEROL 0.5-2.5 (3) MG/3ML IN SOLN
3.0000 mL | Freq: Three times a day (TID) | RESPIRATORY_TRACT | Status: DC
  Administered 2024-01-28 – 2024-02-03 (×16): 3 mL via RESPIRATORY_TRACT
  Filled 2024-01-28 (×18): qty 3

## 2024-01-28 MED ORDER — CYANOCOBALAMIN 500 MCG PO TABS
500.0000 ug | ORAL_TABLET | Freq: Every day | ORAL | Status: DC
  Administered 2024-01-28 – 2024-02-03 (×7): 500 ug via ORAL
  Filled 2024-01-28 (×7): qty 1

## 2024-01-28 NOTE — TOC Progression Note (Signed)
 Transition of Care [facility]) - Progression Note    Patient Details  Name: Jessica Leonard MRN: [id number] Date of Birth: September 27, 1940  Transition of [facility] CM/SW Contact  Quintella Suzen Jansky, RN Phone Number: 01/28/2024, 11:53 AM  Clinical Narrative:     Met with patient at bedside. TOC explained purpose of visit and role. She is agreeable to home health services and has no preference on which agency to use. TOC will continue to follow.     Barriers to Discharge: Continued Medical Work up  Expected Discharge Plan and Services   Discharge Planning Services: CM Consult   Living arrangements for the past 2 months: Single Family Home                                       Social Determinants of Health (SDOH) Interventions SDOH Screenings   Food Insecurity: No Food Insecurity (01/26/2024)  Housing: Low Risk  (01/26/2024)  Transportation Needs: No Transportation Needs (01/26/2024)  Utilities: Not At Risk (01/26/2024)  Financial Resource Strain: Low Risk  (01/19/2024)   Received from [facility]  Social Connections: Moderately Isolated (01/26/2024)  Tobacco Use: Medium Risk (01/25/2024)    Readmission Risk Interventions     No data to display

## 2024-01-28 NOTE — Consult Note (Signed)
 PULMONOLOGY         Date: 01/28/2024,   MRN# [id number] SHONDELL FABEL 11/07/1940     AdmissionWeight: 94.3 kg                 CurrentWeight: 94.3 kg  Referring provider: Dr Dorinda   CHIEF COMPLAINT:   Progressive dyspnea with acute on chronic hypoxemic respiratory failure   HISTORY OF PRESENT ILLNESS   This is an 83 yo F with hx of MDD, lumbago, dyslipidemia, HTN, came in with worsening dyspnea and wheezing over 1-2 week period of time. Patient denies febrile illness, but does admit to having myalgias, subjective chills and was found to be hypoxemic in ER with lowest spO2 at 75%.  She had outpatient failure with treatment of LRTI after completing course of doxycycline and prednisone.  She was also hypertensive and blood work revealed hypokalemia and chest imaging was performed with RLL opacification suggestive of CAP.  Despite 2d of therapy she continues to have hypoxememia and PCCM consult placed for additional evaluation and management. Ive reviewed her CT chest there appears to be elevation of right hemidiagphragm and to a lesser degree left side.     PAST MEDICAL HISTORY   Past Medical History:  Diagnosis Date   Anxiety    Complication of anesthesia    hard time waking up after hysterectomy   Depression    Epigastric pain    Hypertension      SURGICAL HISTORY   Past Surgical History:  Procedure Laterality Date   ABDOMINAL HYSTERECTOMY     APPENDECTOMY     back fusion     BACK SURGERY     BUNIONECTOMY     cataracts     COLONOSCOPY WITH PROPOFOL  N/A 10/08/2020   Procedure: COLONOSCOPY WITH PROPOFOL ;  Surgeon: Unk Corinn Skiff, MD;  Location: [facility] ENDOSCOPY;  Service: Gastroenterology;  Laterality: N/A;   ESOPHAGOGASTRODUODENOSCOPY (EGD) WITH PROPOFOL  N/A 01/15/2020   Procedure: ESOPHAGOGASTRODUODENOSCOPY (EGD) WITH PROPOFOL ;  Surgeon: Unk Corinn Skiff, MD;  Location: [facility] ENDOSCOPY;  Service: Gastroenterology;  Laterality: N/A;    ESOPHAGOGASTRODUODENOSCOPY (EGD) WITH PROPOFOL  N/A 10/08/2020   Procedure: ESOPHAGOGASTRODUODENOSCOPY (EGD) WITH PROPOFOL ;  Surgeon: Unk Corinn Skiff, MD;  Location: [facility] ENDOSCOPY;  Service: Gastroenterology;  Laterality: N/A;   EYE SURGERY     INCONTINENCE SURGERY     TOTAL SHOULDER ARTHROPLASTY Bilateral      FAMILY HISTORY   Family History  Problem Relation Age of Onset   Diabetes Mellitus II Father      SOCIAL HISTORY   Social History   Tobacco Use   Smoking status: Former    Current packs/day: 0.00    Types: Cigarettes    Quit date: 2011    Years since quitting: 14.4   Smokeless tobacco: Never  Vaping Use   Vaping status: Never Used  Substance Use Topics   Alcohol use: Not Currently   Drug use: Never     MEDICATIONS    Home Medication:    Current Medication:  Current Facility-Administered Medications:    acetaminophen  (TYLENOL ) tablet 650 mg, 650 mg, Oral, Q6H PRN, 650 mg at 01/27/24 2212 **OR** acetaminophen  (TYLENOL ) suppository 650 mg, 650 mg, Rectal, Q6H PRN, Mansy, Jan A, MD   azithromycin  (ZITHROMAX ) 500 mg in sodium chloride  0.9 % 250 mL IVPB, 500 mg, Intravenous, Q24H, Mansy, Jan A, MD, Last Rate: 250 mL/hr at 01/28/24 0347, 500 mg at 01/28/24 0347   cefTRIAXone  (ROCEPHIN ) 2 g in sodium chloride  0.9 %  100 mL IVPB, 2 g, Intravenous, Q24H, Mansy, Jan A, MD, Last Rate: 200 mL/hr at 01/27/24 0817, 2 g at 01/27/24 0817   chlorpheniramine-HYDROcodone (TUSSIONEX) 10-8 MG/5ML suspension 5 mL, 5 mL, Oral, Q12H PRN, Mansy, Jan A, MD   enoxaparin  (LOVENOX ) injection 50 mg, 50 mg, Subcutaneous, Q24H, Meegan, Eryn, RPH   guaiFENesin  (MUCINEX ) 12 hr tablet 600 mg, 600 mg, Oral, BID, Mansy, Jan A, MD, 600 mg at 01/27/24 2149   ipratropium-albuterol  (DUONEB) 0.5-2.5 (3) MG/3ML nebulizer solution 3 mL, 3 mL, Nebulization, QID, Mansy, Jan A, MD, 3 mL at 01/28/24 9273   magnesium hydroxide (MILK OF MAGNESIA) suspension 30 mL, 30 mL, Oral, Daily PRN, Mansy, Jan A, MD    ondansetron  (ZOFRAN ) tablet 4 mg, 4 mg, Oral, Q6H PRN **OR** ondansetron  (ZOFRAN ) injection 4 mg, 4 mg, Intravenous, Q6H PRN, Mansy, Jan A, MD   pantoprazole  (PROTONIX ) EC tablet 40 mg, 40 mg, Oral, Daily, Jesus America, NP, 40 mg at 01/27/24 0809   traZODone (DESYREL) tablet 25 mg, 25 mg, Oral, QHS PRN, Mansy, Jan A, MD    ALLERGIES   Sulfa antibiotics, Amoxicillin, Codeine, Clindamycin, Nsaids, and Other     REVIEW OF SYSTEMS    Review of Systems:  Gen:  Denies  fever, sweats, chills weigh loss  HEENT: Denies blurred vision, double vision, ear pain, eye pain, hearing loss, nose bleeds, sore throat Cardiac:  No dizziness, chest pain or heaviness, chest tightness,edema Resp:   reports dyspnea chronically  Gi: Denies swallowing difficulty, stomach pain, nausea or vomiting, diarrhea, constipation, bowel incontinence Gu:  Denies bladder incontinence, burning urine Ext:   Denies Joint pain, stiffness or swelling Skin: Denies  skin rash, easy bruising or bleeding or hives Endoc:  Denies polyuria, polydipsia , polyphagia or weight change Psych:   Denies depression, insomnia or hallucinations   Other:  All other systems negative   VS: BP (!) 162/85 (BP Location: Right Leg)   Pulse 76   Temp 98.6 F (37 C)   Resp 19   Ht 5' 2 (1.575 m)   Wt 94.3 kg   SpO2 92%   BMI 38.04 kg/m      PHYSICAL EXAM    GENERAL:NAD, no fevers, chills, no weakness no fatigue HEAD: Normocephalic, atraumatic.  EYES: Pupils equal, round, reactive to light. Extraocular muscles intact. No scleral icterus.  MOUTH: Moist mucosal membrane. Dentition intact. No abscess noted.  EAR, NOSE, THROAT: Clear without exudates. No external lesions.  NECK: Supple. No thyromegaly. No nodules. No JVD.  PULMONARY: decreased breath sounds with mild rhonchi worse at bases bilaterally.  CARDIOVASCULAR: S1 and S2. Regular rate and rhythm. No murmurs, rubs, or gallops. No edema. Pedal pulses 2+ bilaterally.   GASTROINTESTINAL: Soft, nontender, nondistended. No masses. Positive bowel sounds. No hepatosplenomegaly.  MUSCULOSKELETAL: No swelling, clubbing, or edema. Range of motion full in all extremities.  NEUROLOGIC: Cranial nerves II through XII are intact. No gross focal neurological deficits. Sensation intact. Reflexes intact.  SKIN: No ulceration, lesions, rashes, or cyanosis. Skin warm and dry. Turgor intact.  PSYCHIATRIC: Mood, affect within normal limits. The patient is awake, alert and oriented x 3. Insight, judgment intact.       IMAGING     ASSESSMENT/PLAN   Acute hypoxemic respiratory failure   - right and left hemidiaphragm elevation    - atelectasis    - will obtain respiratory viral panel and CRP trend   - continue therapy for community acquired pneumonia currently on rocephin  zithromax     -  oxygen requirement will improve within 24 hours and should be weaned for goal Spo2 >88%  Bilateral hemidiaphragm elevation    - likely due to large stool burden with distended abdomen   - patient has not had bowel movement in 14 days   - have ordered soap suds enema and suppository    - discussed enema with patient and nursing staff   - post enema patient will receive suppository with dulcolax and lactulose  PO    Bibasilar atelectasis   -patient should use incentive spirometry when able   - will order metaneb with albuterol  with RT   - PT and OT             Thank you for allowing me to participate in the care of this patient.   Patient/Family are satisfied with care plan and all questions have been answered.    Provider disclosure: Patient with at least one acute or chronic illness or injury that poses a threat to life or bodily function and is being managed actively during this encounter.  All of the below services have been performed independently by signing provider:  review of prior documentation from internal and or external health records.  Review of previous and  current lab results.  Interview and comprehensive assessment during patient visit today. Review of current and previous chest radiographs/CT scans. Discussion of management and test interpretation with health care team and patient/family.   This document was prepared using Dragon voice recognition software and may include unintentional dictation errors.     Robson Trickey, M.D.  Division of Pulmonary & Critical Care Medicine

## 2024-01-28 NOTE — Progress Notes (Signed)
 Progress Note   Patient: Jessica Leonard FMW:[id number] DOB: 21-Jan-1941 DOA: 01/24/2024     3 DOS: the patient was seen and examined on 01/28/2024   Brief hospital course:   MATAYAH REYBURN is a 83 y.o. female with medical history significant for anxiety, depression, chronic back pain, essential hypertension, and dyslipidemia who presented to the emergency room, presented to the emergency room with a calcium  of worsening dyspnea with associated cough and wheezing over the last week.  The patient admitted to chills but denies any measured fever.  No nausea or vomiting or abdominal pain.  No dysuria, oliguria or hematuria or flank pain.  No chest pain or palpitations.  She was found to be hypoxic by EMS with a pulse oximetry of 75% on room air.  She does not wear oxygen at home.  She was seen by her PCP on 6/12 and diagnosed with bronchitis.  She was placed on doxycycline and prednisone.  She was initially in the ER on nonrebreather.   ED Course: When she came to the ER, BP was 175/96 with temperature 97.4 axillary and respiratory to 25 and pulse oximetry 75% on room air and 94% on high flow nasal cannula 11L/min.  Heart rate later on was 92.  Labs revealed hypokalemia 3.2 blood glucose 136 with total protein of 6.3 and total bili of 1.3.  Lactic acid was 2.5 and procalcitonin less than 0.1.  High-sensitivity troponin was 7.  CBC showed hemoconcentration.  INR is 1 and PT 13.7.  UA showed 6-10 WBCs with no bacteria and negative nitrite. EKG as reviewed by me : EKG showed sinus rhythm with rate of 87 with left atrial enlargement, right bundle branch block and left intrafascicular block. Imaging: Forward chest x-ray showed low lung volumes and mild left basal atelectasis.  Chest abdomen pelvic CT scan with contrast revealed the following: 1. Mild posterior right lower lobe opacity, favoring mild infection/pneumonia over atelectasis/scarring. 2. 4 mm calculus in the left proximal collecting  system/ureter. No hydronephrosis. 3. Additional ancillary findings as above.         Assessment and Plan: Sepsis due to pneumonia (HCC) - Sepsis is manifested by tachycardia and tachypnea. Continue telemetry Continue intranasal oxygen Continue ceftriaxone  and azithromycin  We will monitor respiratory function closely CODE STATUS discussed with patient and she agreed to be full code if needed to be on ventilator I appreciate the input of pulmonologist   Acute respiratory failure with hypoxia (HCC) - Secondary to community-acquired pneumonia - Management as above. - O2 protocol will be followed.   GERD without esophagitis Continue PPI therapy.   Anxiety and depression - Will continue Cymbalta  and Tofranil    Dyslipidemia - Will continue statin therapy.   Essential hypertension - Continue antihypertensive therapy.     DVT prophylaxis: Lovenox .    Advanced Care Planning:  Code Status: full code.    Disposition Plan: Back to previous home environment   Subjective:  Patient continues to require 15 L of oxygen Chest x-ray did not show any worsening of infiltrate Abdominal imaging showing findings of constipation  Physical Exam:   GENERAL: Elderly female laying in bed in no acute distress EYES: Pupils equal, round, reactive to light and accommodation. No scleral icterus. Extraocular muscles intact.  HEENT: Head atraumatic, normocephalic. Oropharynx and nasopharynx clear.  NECK:  Supple, no jugular venous distention. No thyroid enlargement, no tenderness.  LUNGS: Decreased air entry bibasilarly CARDIOVASCULAR: Regular rate and rhythm, S1, S2 normal. No murmurs, rubs, or gallops.  ABDOMEN:  Soft, nondistended, nontender. Bowel sounds present. No organomegaly or mass.  EXTREMITIES: No pedal edema, cyanosis, or clubbing.  NEUROLOGIC: Cranial nerves II through XII are intact. Muscle strength 5/5 in all extremities. Sensation intact. Gait not checked.  PSYCHIATRIC: The patient  is alert and oriented x 3.  Normal affect and good eye contact. SKIN: No obvious rash, lesion, or ulcer.    Family Communication: None at bedside   Disposition: Status is: Inpatient    Planned Discharge Destination:  Time spent: 45 minutes   Data Reviewed:   This x-ray obtained today reviewed that shows some linear opacity in the right base      Latest Ref Rng & Units 01/28/2024    6:22 AM 01/27/2024    5:48 AM 01/26/2024    6:01 AM  BMP  Glucose 70 - 99 mg/dL 96  99  89   BUN 8 - 23 mg/dL 12  16  17    Creatinine 0.44 - 1.00 mg/dL 9.46  9.40  9.31   Sodium 135 - 145 mmol/L 142  142  141   Potassium 3.5 - 5.1 mmol/L 4.0  3.8  3.7   Chloride 98 - 111 mmol/L 104  104  103   CO2 22 - 32 mmol/L 31  30  29    Calcium  8.9 - 10.3 mg/dL 9.0  9.0  8.8          Latest Ref Rng & Units 01/28/2024    6:22 AM 01/27/2024    5:48 AM 01/26/2024    6:01 AM  CBC  WBC 4.0 - 10.5 K/uL 8.2  9.0  8.5   Hemoglobin 12.0 - 15.0 g/dL 85.4  85.9  85.6   Hematocrit 36.0 - 46.0 % 45.2  42.8  44.5   Platelets 150 - 400 K/uL 178  201  204       Vitals:   01/28/24 0726 01/28/24 1101 01/28/24 1150 01/28/24 1500  BP:  136/84    Pulse:   82   Resp:   16   Temp:   99 F (37.2 C)   TempSrc:      SpO2: 92%  93% 93%  Weight:      Height:         Author: Drue ONEIDA Potter, MD 01/28/2024 4:14 PM  For on call review [email or web address].

## 2024-01-28 NOTE — Plan of Care (Signed)
  Problem: Fluid Volume: Goal: Hemodynamic stability will improve Outcome: Progressing   Problem: Clinical Measurements: Goal: Diagnostic test results will improve Outcome: Progressing   Problem: Education: Goal: Knowledge of General Education information will improve Description: Including pain rating scale, medication(s)/side effects and non-pharmacologic comfort measures Outcome: Progressing   Problem: Nutrition: Goal: Adequate nutrition will be maintained Outcome: Progressing   Problem: Coping: Goal: Level of anxiety will decrease Outcome: Progressing

## 2024-01-29 DIAGNOSIS — A419 Sepsis, unspecified organism: Secondary | ICD-10-CM | POA: Diagnosis not present

## 2024-01-29 DIAGNOSIS — J189 Pneumonia, unspecified organism: Secondary | ICD-10-CM | POA: Diagnosis not present

## 2024-01-29 LAB — BASIC METABOLIC PANEL WITH GFR
Anion gap: 7 (ref 5–15)
BUN: 11 mg/dL (ref 8–23)
CO2: 28 mmol/L (ref 22–32)
Calcium: 8.9 mg/dL (ref 8.9–10.3)
Chloride: 106 mmol/L (ref 98–111)
Creatinine, Ser: 0.47 mg/dL (ref 0.44–1.00)
GFR, Estimated: >60 mL/min (ref >60)
Glucose, Bld: 107 mg/dL — ABNORMAL HIGH (ref 70–99)
Potassium: 3.9 mmol/L (ref 3.5–5.1)
Sodium: 141 mmol/L (ref 135–145)

## 2024-01-29 LAB — CBC WITH DIFFERENTIAL/PLATELET
Abs Immature Granulocytes: 0.03 10*3/uL (ref 0.00–0.07)
Basophils Absolute: 0 10*3/uL (ref 0.0–0.1)
Basophils Relative: 0 %
Eosinophils Absolute: 0.4 10*3/uL (ref 0.0–0.5)
Eosinophils Relative: 4 %
HCT: 42.8 % (ref 36.0–46.0)
Hemoglobin: 14.2 g/dL (ref 12.0–15.0)
Immature Granulocytes: 0 %
Lymphocytes Relative: 15 %
Lymphs Abs: 1.4 10*3/uL (ref 0.7–4.0)
MCH: 31.8 pg (ref 26.0–34.0)
MCHC: 33.2 g/dL (ref 30.0–36.0)
MCV: 96 fL (ref 80.0–100.0)
Monocytes Absolute: 1.1 10*3/uL — ABNORMAL HIGH (ref 0.1–1.0)
Monocytes Relative: 12 %
Neutro Abs: 6.3 10*3/uL (ref 1.7–7.7)
Neutrophils Relative %: 69 %
Platelets: 169 10*3/uL (ref 150–400)
RBC: 4.46 MIL/uL (ref 3.87–5.11)
RDW: 13.4 % (ref 11.5–15.5)
WBC: 9.2 10*3/uL (ref 4.0–10.5)
nRBC: 0 % (ref 0.0–0.2)

## 2024-01-29 LAB — RESPIRATORY PANEL BY PCR

## 2024-01-29 LAB — C-REACTIVE PROTEIN: CRP: 4.4 mg/dL — ABNORMAL HIGH (ref <1.0)

## 2024-01-29 MED ORDER — METHYLPREDNISOLONE SODIUM SUCC 40 MG IJ SOLR
40.0000 mg | Freq: Every day | INTRAMUSCULAR | Status: DC
  Administered 2024-01-29 – 2024-02-01 (×4): 40 mg via INTRAVENOUS
  Filled 2024-01-29 (×4): qty 1

## 2024-01-29 NOTE — Progress Notes (Addendum)
 PT Cancellation Note  Patient Details Name: Jessica Leonard MRN: [id number] DOB: Sep 15, 1940   Cancelled Treatment:    Reason Eval/Treat Not Completed: Patient declined, no reason specified. Pt on 15 L HFNC with SpO2 at 86% and reported recently having finished a breathing treatment. However, she stated that she was feeling generally not well after the breathing treatment and requesting to hold PT for today. PT educated pt on the benefits of mobility as well as use of incentive spirometer. Pt stated she was hopeful to get out of the bed to sit in the chair later today and that she has been using her IS throughout the day. PT will continue to follow-up with pt acutely as available and appropriate.    Delon HERO [name] 01/29/2024, 8:32 AM

## 2024-01-29 NOTE — Progress Notes (Signed)
 Progress Note   Patient: Jessica Leonard FMW:[id number] DOB: Apr 11, 1941 DOA: 01/24/2024     4 DOS: the patient was seen and examined on 01/29/2024     Brief hospital course:   Jessica Leonard is a 83 y.o. female with medical history significant for anxiety, depression, chronic back pain, essential hypertension, and dyslipidemia who presented to the emergency room, presented to the emergency room with a calcium  of worsening dyspnea with associated cough and wheezing over the last week.  The patient admitted to chills but denies any measured fever.  No nausea or vomiting or abdominal pain.  No dysuria, oliguria or hematuria or flank pain.  No chest pain or palpitations.  She was found to be hypoxic by EMS with a pulse oximetry of 75% on room air.  She does not wear oxygen at home.  She was seen by her PCP on 6/12 and diagnosed with bronchitis.  She was placed on doxycycline and prednisone.  She was initially in the ER on nonrebreather.   ED Course: When she came to the ER, BP was 175/96 with temperature 97.4 axillary and respiratory to 25 and pulse oximetry 75% on room air and 94% on high flow nasal cannula 11L/min.  Heart rate later on was 92.  Labs revealed hypokalemia 3.2 blood glucose 136 with total protein of 6.3 and total bili of 1.3.  Lactic acid was 2.5 and procalcitonin less than 0.1.  High-sensitivity troponin was 7.  CBC showed hemoconcentration.  INR is 1 and PT 13.7.  UA showed 6-10 WBCs with no bacteria and negative nitrite. EKG as reviewed by me : EKG showed sinus rhythm with rate of 87 with left atrial enlargement, right bundle branch block and left intrafascicular block. Imaging: Forward chest x-ray showed low lung volumes and mild left basal atelectasis.  Chest abdomen pelvic CT scan with contrast revealed the following: 1. Mild posterior right lower lobe opacity, favoring mild infection/pneumonia over atelectasis/scarring. 2. 4 mm calculus in the left proximal collecting  system/ureter. No hydronephrosis. 3. Additional ancillary findings as above.         Assessment and Plan: Sepsis due to pneumonia (HCC) - Sepsis is manifested by tachycardia and tachypnea. Continue telemetry Continue intranasal oxygen Continue ceftriaxone  and azithromycin  We will monitor respiratory function closely I appreciate the input of pulmonologist   Acute respiratory failure with hypoxia (HCC) - Secondary to community-acquired pneumonia - Management as above. - O2 protocol will be followed.   GERD without esophagitis Continue PPI therapy.   Anxiety and depression - Will continue Cymbalta  and Tofranil    Dyslipidemia - Will continue statin therapy.   Essential hypertension - Continue antihypertensive therapy.     DVT prophylaxis: Lovenox .    Advanced Care Planning:  Code Status: full code.    Disposition Plan: Back to previous home environment   Subjective:  Patient continues to require 15 L of oxygen Patient has not been compliant with her oxygen and have been taking it off on several occasions We have spoken with patient about it Denied worsening respiratory function chest pain nausea vomiting    Physical Exam:   GENERAL: Elderly female laying in bed in no acute distress EYES: Pupils equal, round, reactive to light and accommodation. No scleral icterus. Extraocular muscles intact.  HEENT: Head atraumatic, normocephalic. Oropharynx and nasopharynx clear.  NECK:  Supple, no jugular venous distention. No thyroid enlargement, no tenderness.  LUNGS: Decreased air entry bibasilarly CARDIOVASCULAR: Regular rate and rhythm, S1, S2 normal. No murmurs, rubs, or  gallops.  ABDOMEN: Soft, nondistended, nontender. Bowel sounds present. No organomegaly or mass.  EXTREMITIES: No pedal edema, cyanosis, or clubbing.  NEUROLOGIC: Cranial nerves II through XII are intact. Muscle strength 5/5 in all extremities. Sensation intact. Gait not checked.  PSYCHIATRIC: The  patient is alert and oriented x 3.  Normal affect and good eye contact. SKIN: No obvious rash, lesion, or ulcer.    Family Communication: None at bedside   Disposition: Status is: Inpatient    Planned Discharge Destination:  Time spent: 41  minutes   Data Reviewed:   This x-ray obtained today reviewed that shows some linear opacity in the right base  Vitals:   01/29/24 1439 01/29/24 1500 01/29/24 1533 01/29/24 1600  BP:   134/80   Pulse:   85   Resp:  (!) 30 (!) 22 (!) 21  Temp:   97.8 F (36.6 C)   TempSrc:   Oral   SpO2: (!) 82%  90%   Weight:      Height:          Latest Ref Rng & Units 01/29/2024    5:58 AM 01/28/2024    6:22 AM 01/27/2024    5:48 AM  CBC  WBC 4.0 - 10.5 K/uL 9.2  8.2  9.0   Hemoglobin 12.0 - 15.0 g/dL 85.7  85.4  85.9   Hematocrit 36.0 - 46.0 % 42.8  45.2  42.8   Platelets 150 - 400 K/uL 169  178  201        Latest Ref Rng & Units 01/29/2024    5:58 AM 01/28/2024    6:22 AM 01/27/2024    5:48 AM  BMP  Glucose 70 - 99 mg/dL 892  96  99   BUN 8 - 23 mg/dL 11  12  16    Creatinine 0.44 - 1.00 mg/dL 9.52  9.46  9.40   Sodium 135 - 145 mmol/L 141  142  142   Potassium 3.5 - 5.1 mmol/L 3.9  4.0  3.8   Chloride 98 - 111 mmol/L 106  104  104   CO2 22 - 32 mmol/L 28  31  30    Calcium  8.9 - 10.3 mg/dL 8.9  9.0  9.0      Author: Drue ONEIDA Potter, MD 01/29/2024 4:52 PM  For on call review [email or web address].

## 2024-01-29 NOTE — Plan of Care (Signed)
 Pt A&O x 4, follows commands, alert  Pt given PO Tylenol  x 1 for chronic back pain. Pt started on HFNC 15L, then switched to venturi mask due to left sided nasal irritation. Pt did not like the mask bc she could not eat, pt placed on 6L  with O2 saturations 90-91%. Pt walked with assistance to chair and used bed side commode.     Problem: Fluid Volume: Goal: Hemodynamic stability will improve Outcome: Progressing   Problem: Clinical Measurements: Goal: Diagnostic test results will improve Outcome: Progressing Goal: Signs and symptoms of infection will decrease Outcome: Progressing   Problem: Respiratory: Goal: Ability to maintain adequate ventilation will improve Outcome: Progressing   Problem: Education: Goal: Knowledge of General Education information will improve Description: Including pain rating scale, medication(s)/side effects and non-pharmacologic comfort measures Outcome: Progressing   Problem: Health Behavior/Discharge Planning: Goal: Ability to manage health-related needs will improve Outcome: Progressing   Problem: Clinical Measurements: Goal: Ability to maintain clinical measurements within normal limits will improve Outcome: Progressing Goal: Will remain free from infection Outcome: Progressing Goal: Diagnostic test results will improve Outcome: Progressing Goal: Respiratory complications will improve Outcome: Progressing Goal: Cardiovascular complication will be avoided Outcome: Progressing   Problem: Activity: Goal: Risk for activity intolerance will decrease Outcome: Progressing   Problem: Nutrition: Goal: Adequate nutrition will be maintained Outcome: Progressing   Problem: Coping: Goal: Level of anxiety will decrease Outcome: Progressing   Problem: Elimination: Goal: Will not experience complications related to bowel motility Outcome: Progressing Goal: Will not experience complications related to urinary retention Outcome: Progressing    Problem: Pain Managment: Goal: General experience of comfort will improve and/or be controlled Outcome: Progressing   Problem: Safety: Goal: Ability to remain free from injury will improve Outcome: Progressing   Problem: Skin Integrity: Goal: Risk for impaired skin integrity will decrease Outcome: Progressing

## 2024-01-29 NOTE — Progress Notes (Signed)
 Pt. Refused to put oxygen back on after her metaneb tx. Despite being advised her o2 saturation is 82 on room air.

## 2024-01-29 NOTE — Progress Notes (Signed)
 PULMONOLOGY         Date: 01/29/2024,   MRN# [id number] WINOLA DRUM 11-Jun-1941     AdmissionWeight: 94.3 kg                 CurrentWeight: 94.3 kg  Referring provider: Dr Dorinda   CHIEF COMPLAINT:   Progressive dyspnea with acute on chronic hypoxemic respiratory failure   HISTORY OF PRESENT ILLNESS   This is an 83 yo F with hx of MDD, lumbago, dyslipidemia, HTN, came in with worsening dyspnea and wheezing over 1-2 week period of time. Patient denies febrile illness, but does admit to having myalgias, subjective chills and was found to be hypoxemic in ER with lowest spO2 at 75%.  She had outpatient failure with treatment of LRTI after completing course of doxycycline and prednisone.  She was also hypertensive and blood work revealed hypokalemia and chest imaging was performed with RLL opacification suggestive of CAP.  Despite 2d of therapy she continues to have hypoxememia and PCCM consult placed for additional evaluation and management. Ive reviewed her CT chest there appears to be elevation of right hemidiagphragm and to a lesser degree left side.    01/29/24- patient had large BM and feels better.  She remains on 15L/min, however on examination the nasal canula is not in her nose but hanging off upper lip and patient reports left nare is obstructed.  She previously was seen by ENT but does not recall what her diagnosis was.  Ive discussed this with patient and family at bedside including daughter and son in law. Further after consulting with RT we will change interface to mask and see if patient will be weaned better since she primary is mouth breathing. She did participate with metaneb. Repeat CXR with persistent atelectatic changes but right lower lobe is improved.    PAST MEDICAL HISTORY   Past Medical History:  Diagnosis Date   Anxiety    Complication of anesthesia    hard time waking up after hysterectomy   Depression    Epigastric pain    Hypertension       SURGICAL HISTORY   Past Surgical History:  Procedure Laterality Date   ABDOMINAL HYSTERECTOMY     APPENDECTOMY     back fusion     BACK SURGERY     BUNIONECTOMY     cataracts     COLONOSCOPY WITH PROPOFOL  N/A 10/08/2020   Procedure: COLONOSCOPY WITH PROPOFOL ;  Surgeon: Unk Corinn Skiff, MD;  Location: [facility];  Service: Gastroenterology;  Laterality: N/A;   ESOPHAGOGASTRODUODENOSCOPY (EGD) WITH PROPOFOL  N/A 01/15/2020   Procedure: ESOPHAGOGASTRODUODENOSCOPY (EGD) WITH PROPOFOL ;  Surgeon: Unk Corinn Skiff, MD;  Location: [facility]COPY;  Service: Gastroenterology;  Laterality: N/A;   ESOPHAGOGASTRODUODENOSCOPY (EGD) WITH PROPOFOL  N/A 10/08/2020   Procedure: ESOPHAGOGASTRODUODENOSCOPY (EGD) WITH PROPOFOL ;  Surgeon: Unk Corinn Skiff, MD;  Location: [facility]COPY;  Service: Gastroenterology;  Laterality: N/A;   EYE SURGERY     INCONTINENCE SURGERY     TOTAL SHOULDER ARTHROPLASTY Bilateral      FAMILY HISTORY   Family History  Problem Relation Age of Onset   Diabetes Mellitus II Father      SOCIAL HISTORY   Social History   Tobacco Use   Smoking status: Former    Current packs/day: 0.00    Types: Cigarettes    Quit date: 2011    Years since quitting: 14.4   Smokeless tobacco: Never  Vaping Use   Vaping status: Never Used  Substance Use Topics   Alcohol use: Not Currently   Drug use: Never     MEDICATIONS    Home Medication:    Current Medication:  Current Facility-Administered Medications:    acetaminophen  (TYLENOL ) tablet 650 mg, 650 mg, Oral, Q6H PRN, 650 mg at 01/29/24 0843 **OR** acetaminophen  (TYLENOL ) suppository 650 mg, 650 mg, Rectal, Q6H PRN, Mansy, Jan A, MD   artificial tears ophthalmic solution 1-2 drop, 1-2 drop, Both Eyes, PRN, Djan, Drue DASEN, MD   atorvastatin  (LIPITOR) tablet 20 mg, 20 mg, Oral, QHS, Djan, Prince T, MD, 20 mg at 01/28/24 2223   calcium -vitamin D  (OSCAL WITH D) 500-5 MG-MCG per tablet 1 tablet, 1 tablet, Oral,  BID WC, Dorinda Drue DASEN, MD, 1 tablet at 01/29/24 9160   cefTRIAXone  (ROCEPHIN ) 2 g in sodium chloride  0.9 % 100 mL IVPB, 2 g, Intravenous, Q24H, Mansy, Jan A, MD, Last Rate: 200 mL/hr at 01/29/24 0853, 2 g at 01/29/24 0853   cholecalciferol  (VITAMIN D3) 25 MCG (1000 UNIT) tablet 1,000 Units, 1,000 Units, Oral, Daily, Dorinda Drue DASEN, MD, 1,000 Units at 01/29/24 9160   cyanocobalamin  (VITAMIN B12) tablet 500 mcg, 500 mcg, Oral, Daily, Dorinda Drue T, MD, 500 mcg at 01/29/24 9160   DULoxetine  (CYMBALTA ) DR capsule 60 mg, 60 mg, Oral, Daily, Djan, Drue DASEN, MD, 60 mg at 01/29/24 9160   enoxaparin  (LOVENOX ) injection 50 mg, 50 mg, Subcutaneous, Q24H, Meegan, Eryn, RPH, 50 mg at 01/29/24 9162   fluticasone  (FLONASE ) 50 MCG/ACT nasal spray 1 spray, 1 spray, Each Nare, Daily, Djan, Drue DASEN, MD, 1 spray at 01/29/24 9143   guaiFENesin  (MUCINEX ) 12 hr tablet 600 mg, 600 mg, Oral, BID, Mansy, Jan A, MD, 600 mg at 01/29/24 9161   ipratropium-albuterol  (DUONEB) 0.5-2.5 (3) MG/3ML nebulizer solution 3 mL, 3 mL, Nebulization, TID, Tyriek Hofman, MD, 3 mL at 01/29/24 0725   lactulose  (CHRONULAC ) 10 GM/15ML solution 20 g, 20 g, Oral, Daily, Pippa Hanif, MD   magnesium hydroxide (MILK OF MAGNESIA) suspension 30 mL, 30 mL, Oral, Daily PRN, Mansy, Jan A, MD   multivitamin (PROSIGHT) tablet 1 tablet, 1 tablet, Oral, BID, Niels Kayla FALCON, RPH, 1 tablet at 01/29/24 0845   NIFEdipine  (ADALAT  CC) 24 hr tablet 60 mg, 60 mg, Oral, Daily, Djan, Prince T, MD, 60 mg at 01/29/24 0845   ondansetron  (ZOFRAN ) tablet 4 mg, 4 mg, Oral, Q6H PRN **OR** ondansetron  (ZOFRAN ) injection 4 mg, 4 mg, Intravenous, Q6H PRN, Mansy, Jan A, MD   pantoprazole  (PROTONIX ) EC tablet 40 mg, 40 mg, Oral, Daily, Jesus America, NP, 40 mg at 01/29/24 9161   traZODone (DESYREL) tablet 25 mg, 25 mg, Oral, QHS PRN, Mansy, Jan A, MD    ALLERGIES   Sulfa antibiotics, Amoxicillin, Codeine, Clindamycin, Nsaids, and Other     REVIEW OF  SYSTEMS    Review of Systems:  Gen:  Denies  fever, sweats, chills weigh loss  HEENT: Denies blurred vision, double vision, ear pain, eye pain, hearing loss, nose bleeds, sore throat Cardiac:  No dizziness, chest pain or heaviness, chest tightness,edema Resp:   reports dyspnea chronically  Gi: Denies swallowing difficulty, stomach pain, nausea or vomiting, diarrhea, constipation, bowel incontinence Gu:  Denies bladder incontinence, burning urine Ext:   Denies Joint pain, stiffness or swelling Skin: Denies  skin rash, easy bruising or bleeding or hives Endoc:  Denies polyuria, polydipsia , polyphagia or weight change Psych:   Denies depression, insomnia or hallucinations   Other:  All other systems negative  VS: BP 125/75 (BP Location: Left Wrist)   Pulse 80   Temp 97.6 F (36.4 C) (Oral)   Resp (!) 23   Ht 5' 2 (1.575 m)   Wt 94.3 kg   SpO2 91%   BMI 38.04 kg/m      PHYSICAL EXAM    GENERAL:NAD, no fevers, chills, no weakness no fatigue HEAD: Normocephalic, atraumatic.  EYES: Pupils equal, round, reactive to light. Extraocular muscles intact. No scleral icterus.  MOUTH: Moist mucosal membrane. Dentition intact. No abscess noted.  EAR, NOSE, THROAT: Clear without exudates. No external lesions.  NECK: Supple. No thyromegaly. No nodules. No JVD.  PULMONARY: decreased breath sounds with mild rhonchi worse at bases bilaterally.  CARDIOVASCULAR: S1 and S2. Regular rate and rhythm. No murmurs, rubs, or gallops. No edema. Pedal pulses 2+ bilaterally.  GASTROINTESTINAL: Soft, nontender, nondistended. No masses. Positive bowel sounds. No hepatosplenomegaly.  MUSCULOSKELETAL: No swelling, clubbing, or edema. Range of motion full in all extremities.  NEUROLOGIC: Cranial nerves II through XII are intact. No gross focal neurological deficits. Sensation intact. Reflexes intact.  SKIN: No ulceration, lesions, rashes, or cyanosis. Skin warm and dry. Turgor intact.  PSYCHIATRIC: Mood,  affect within normal limits. The patient is awake, alert and oriented x 3. Insight, judgment intact.       IMAGING     ASSESSMENT/PLAN   Acute hypoxemic respiratory failure   - right and left hemidiaphragm elevation    - atelectasis    - will obtain respiratory viral panel and CRP trend   - continue therapy for community acquired pneumonia currently on rocephin  zithromax     -oxygen requirement will improve within 24 hours and should be weaned for goal Spo2 >88%   -currently req  Bilateral hemidiaphragm elevation    - due to obese body habitus   - improved from yesterday post large BM via enema  Bibasilar atelectasis   -patient should use incentive spirometry when able   - will order metaneb with albuterol  with RT   - PT and OT             Thank you for allowing me to participate in the care of this patient.   Patient/Family are satisfied with care plan and all questions have been answered.    Provider disclosure: Patient with at least one acute or chronic illness or injury that poses a threat to life or bodily function and is being managed actively during this encounter.  All of the below services have been performed independently by signing provider:  review of prior documentation from internal and or external health records.  Review of previous and current lab results.  Interview and comprehensive assessment during patient visit today. Review of current and previous chest radiographs/CT scans. Discussion of management and test interpretation with health care team and patient/family.   This document was prepared using Dragon voice recognition software and may include unintentional dictation errors.     Treyden Hakim, M.D.  Division of Pulmonary & Critical Care Medicine

## 2024-01-30 DIAGNOSIS — A419 Sepsis, unspecified organism: Secondary | ICD-10-CM | POA: Diagnosis not present

## 2024-01-30 DIAGNOSIS — J189 Pneumonia, unspecified organism: Secondary | ICD-10-CM | POA: Diagnosis not present

## 2024-01-30 LAB — CBC WITH DIFFERENTIAL/PLATELET
Abs Immature Granulocytes: 0.04 10*3/uL (ref 0.00–0.07)
Basophils Absolute: 0 10*3/uL (ref 0.0–0.1)
Basophils Relative: 0 %
Eosinophils Absolute: 0 10*3/uL (ref 0.0–0.5)
Eosinophils Relative: 0 %
HCT: 41.8 % (ref 36.0–46.0)
Hemoglobin: 13.7 g/dL (ref 12.0–15.0)
Immature Granulocytes: 1 %
Lymphocytes Relative: 18 %
Lymphs Abs: 1.5 10*3/uL (ref 0.7–4.0)
MCH: 31.2 pg (ref 26.0–34.0)
MCHC: 32.8 g/dL (ref 30.0–36.0)
MCV: 95.2 fL (ref 80.0–100.0)
Monocytes Absolute: 0.9 10*3/uL (ref 0.1–1.0)
Monocytes Relative: 10 %
Neutro Abs: 6.2 10*3/uL (ref 1.7–7.7)
Neutrophils Relative %: 71 %
Platelets: 187 10*3/uL (ref 150–400)
RBC: 4.39 MIL/uL (ref 3.87–5.11)
RDW: 13.2 % (ref 11.5–15.5)
WBC: 8.7 10*3/uL (ref 4.0–10.5)
nRBC: 0 % (ref 0.0–0.2)

## 2024-01-30 LAB — BASIC METABOLIC PANEL WITH GFR
Anion gap: 8 (ref 5–15)
BUN: 10 mg/dL (ref 8–23)
CO2: 27 mmol/L (ref 22–32)
Calcium: 9.1 mg/dL (ref 8.9–10.3)
Chloride: 107 mmol/L (ref 98–111)
Creatinine, Ser: 0.48 mg/dL (ref 0.44–1.00)
GFR, Estimated: >60 mL/min (ref >60)
Glucose, Bld: 110 mg/dL — ABNORMAL HIGH (ref 70–99)
Potassium: 3.9 mmol/L (ref 3.5–5.1)
Sodium: 142 mmol/L (ref 135–145)

## 2024-01-30 LAB — CULTURE, BLOOD (ROUTINE X 2)
Culture: NO GROWTH
Culture: NO GROWTH

## 2024-01-30 LAB — C-REACTIVE PROTEIN: CRP: 3.5 mg/dL — ABNORMAL HIGH (ref <1.0)

## 2024-01-30 NOTE — Plan of Care (Signed)
 Patient remains free from any noted signs of acute distress.  Continues to have O2, currently at 7L, via [place], continuous.  Patient noted to tolerate O2 therapy.  Continues to have cough, with noted small amount of thick white sputum.  NO additional medical intervention has been initiated at this time.  Patient has been assisted to [facility] throughout current shift.  Patient to continue to be monitored by hospital staff.

## 2024-01-30 NOTE — Care Management Important Message (Signed)
 Important Message  Patient Details  Name: Jessica Leonard MRN: [id number] Date of Birth: 10-07-1940   Important Message Given:  Yes - Medicare IM     Xcaret Morad W, CMA 01/30/2024, 2:52 PM

## 2024-01-30 NOTE — Progress Notes (Signed)
 Physical Therapy Treatment Patient Details Name: Jessica Leonard MRN: [id number] DOB: 22-Mar-1941 Today's Date: 01/30/2024   History of Present Illness Pt is an 83 y.o. female who presented to the ER with a calcium  of worsening dyspnea with associated cough and wheezing over the last week. Seen by her PCP on 6/12 and diagnosed with bronchitis. Admitted for management of sepsis d/t pneumonia, acute respiratory failure with hypoxia. PMH of anxiety, depression, chronic back pain, essential hypertension, and dyslipidemia    PT Comments  Pt was pleasant and motivated to participate during the session and put forth good effort throughout. Pt found on 6LO2/min with SpO2 90-91% at rest.  Pt taken through graded intensity activity starting with supine therex and progressing to sitting therex, transfers, and then graded amb distances.  Pt's SpO2 was measure frequently throughout and ranged from 86-88% during all therex and bed mobility tasks and dropped to a low of 83-84% during amb but quickly returned to 89-90% upon returning to sitting.  Pt reported no adverse symptoms during the session with SpO2 at 91% in recliner at end of session. Pt will benefit from continued PT services upon discharge to safely address deficits listed in patient problem list for decreased caregiver assistance and eventual return to PLOF.       If plan is discharge home, recommend the following: A little help with walking and/or transfers;Assistance with cooking/housework;Assist for transportation;Help with stairs or ramp for entrance;A little help with bathing/dressing/bathroom   Can travel by private vehicle        Equipment Recommendations  None recommended by PT    Recommendations for Other Services       Precautions / Restrictions Precautions Precautions: Fall Recall of Precautions/Restrictions: Intact Precaution/Restrictions Comments: Monitor SPO2% Restrictions Weight Bearing Restrictions Per Provider Order: No      Mobility  Bed Mobility Overal bed mobility: Needs Assistance Bed Mobility: Supine to Sit     Supine to sit: Supervision     General bed mobility comments: Min extra time and effort only    Transfers Overall transfer level: Needs assistance Equipment used: Rolling walker (2 wheels)   Sit to Stand: Supervision           General transfer comment: Good eccentric and concentric control and stability from various height surfaces with min verbal cues for hand placement    Ambulation/Gait Ambulation/Gait assistance: Supervision, Contact guard assist Gait Distance (Feet): 5 Feet x 1, 15 Feet x 1, 20 Feet x 1 Assistive device: Rolling walker (2 wheels) Gait Pattern/deviations: Step-through pattern, Trunk flexed, Decreased step length - right, Decreased step length - left Gait velocity: decreased     General Gait Details: Slow cadence but steady with no overt LOB including during backwards ambulation   Stairs             Wheelchair Mobility     Tilt Bed    Modified Rankin (Stroke Patients Only)       Balance Overall balance assessment: Needs assistance   Sitting balance-Leahy Scale: Normal     Standing balance support: Bilateral upper extremity supported, During functional activity Standing balance-Leahy Scale: Good Standing balance comment: light lean on the RW for support with good stability throughout                            Communication Communication Communication: No apparent difficulties  Cognition Arousal: Alert Behavior During Therapy: WFL for tasks assessed/performed   PT - Cognitive impairments:  No apparent impairments                                Cueing Cueing Techniques: Verbal cues, Gestural cues, Tactile cues  Exercises Total Joint Exercises Ankle Circles/Pumps: AROM, Strengthening, Both, 15 reps Quad Sets: Strengthening, Both, 15 reps Gluteal Sets: Strengthening, Both, 15 reps Long Arc Quad: AROM,  Strengthening, Both, 15 reps Knee Flexion: AROM, Strengthening, Both, 15 reps Other Exercises Other Exercises: Pt education/review on PLB and general energy conservation techniques    General Comments        Pertinent Vitals/Pain Pain Assessment Pain Assessment: No/denies pain    Home Living                          Prior Function            PT Goals (current goals can now be found in the care plan section) Progress towards PT goals: Progressing toward goals    Frequency    Min 2X/week      PT Plan      Co-evaluation              AM-PAC PT 6 Clicks Mobility   Outcome Measure  Help needed turning from your back to your side while in a flat bed without using bedrails?: None Help needed moving from lying on your back to sitting on the side of a flat bed without using bedrails?: A Little Help needed moving to and from a bed to a chair (including a wheelchair)?: A Little Help needed standing up from a chair using your arms (e.g., wheelchair or bedside chair)?: A Little Help needed to walk in hospital room?: A Little Help needed climbing 3-5 steps with a railing? : A Little 6 Click Score: 19    End of Session Equipment Utilized During Treatment: Oxygen;Gait belt Activity Tolerance: Patient tolerated treatment well Patient left: in chair;with call bell/phone within reach;with chair alarm set;with family/visitor present Nurse Communication: Mobility status PT Visit Diagnosis: Unsteadiness on feet (R26.81);Other abnormalities of gait and mobility (R26.89);Repeated falls (R29.6)     Time: 8673-8646 PT Time Calculation (min) (ACUTE ONLY): 27 min  Charges:    $Gait Training: 8-22 mins $Therapeutic Exercise: 8-22 mins PT General Charges $$ ACUTE PT VISIT: 1 Visit                     D. Scott Alizey Noren PT, DPT 01/30/24, 2:10 PM

## 2024-01-30 NOTE — Progress Notes (Signed)
 Progress Note   Patient: Jessica Leonard FMW:[id number] DOB: [birth year] DOA: 01/24/2024     5 DOS: the patient was seen and examined on 01/30/2024    Brief hospital course:   Jessica Leonard is a 83 y.o. female with medical history significant for anxiety, depression, chronic back pain, essential hypertension, and dyslipidemia who presented to the emergency room, presented to the emergency room with a calcium  of worsening dyspnea with associated cough and wheezing over the last week.  The patient admitted to chills but denies any measured fever.  No nausea or vomiting or abdominal pain.  No dysuria, oliguria or hematuria or flank pain.  No chest pain or palpitations.  She was found to be hypoxic by EMS with a pulse oximetry of 75% on room air.  She does not wear oxygen at home.  She was seen by her PCP on 6/12 and diagnosed with bronchitis.  She was placed on doxycycline and prednisone.  She was initially in the ER on nonrebreather.   ED Course: When she came to the ER, BP was 175/96 with temperature 97.4 axillary and respiratory to 25 and pulse oximetry 75% on room air and 94% on high flow nasal cannula 11L/min.  Heart rate later on was 92.  Labs revealed hypokalemia 3.2 blood glucose 136 with total protein of 6.3 and total bili of 1.3.  Lactic acid was 2.5 and procalcitonin less than 0.1.  High-sensitivity troponin was 7.  CBC showed hemoconcentration.  INR is 1 and PT 13.7.  UA showed 6-10 WBCs with no bacteria and negative nitrite. EKG as reviewed by me : EKG showed sinus rhythm with rate of 87 with left atrial enlargement, right bundle branch block and left intrafascicular block. Imaging: Forward chest x-ray showed low lung volumes and mild left basal atelectasis.  Chest abdomen pelvic CT scan with contrast revealed the following: 1. Mild posterior right lower lobe opacity, favoring mild infection/pneumonia over atelectasis/scarring. 2. 4 mm calculus in the left proximal collecting  system/ureter. No hydronephrosis. 3. Additional ancillary findings as above.         Assessment and Plan: Sepsis due to pneumonia (HCC) - Sepsis is manifested by tachycardia and tachypnea. Continue telemetry Continue intranasal oxygen Continue ceftriaxone  and azithromycin  We will monitor respiratory function closely I appreciate the input of pulmonologist   Acute respiratory failure with hypoxia (HCC) - Secondary to community-acquired pneumonia - Management as above. - O2 protocol will be followed.   GERD without esophagitis Continue PPI therapy.   Anxiety and depression - Will continue Cymbalta  and Tofranil    Dyslipidemia - Will continue statin therapy.   Essential hypertension - Continue antihypertensive therapy.     DVT prophylaxis: Lovenox .    Advanced Care Planning:  Code Status: full code.    Disposition Plan: Back to previous home environment   Subjective:  Oxygen requirement has improved to 6 L today Denied worsening respiratory function chest pain nausea vomiting     Physical Exam:   GENERAL: Elderly female laying in bed in no acute distress EYES: Pupils equal, round, reactive to light and accommodation. No scleral icterus. Extraocular muscles intact.  HEENT: Head atraumatic, normocephalic. Oropharynx and nasopharynx clear.  NECK:  Supple, no jugular venous distention. No thyroid enlargement, no tenderness.  LUNGS: Decreased air entry bibasilarly CARDIOVASCULAR: Regular rate and rhythm, S1, S2 normal. No murmurs, rubs, or gallops.  ABDOMEN: Soft, nondistended, nontender. Bowel sounds present. No organomegaly or mass.  EXTREMITIES: No pedal edema, cyanosis, or clubbing.  NEUROLOGIC: Cranial  nerves II through XII are intact. Muscle strength 5/5 in all extremities. Sensation intact. Gait not checked.  PSYCHIATRIC: The patient is alert and oriented x 3.  Normal affect and good eye contact. SKIN: No obvious rash, lesion, or ulcer.    Family  Communication: None at bedside   Disposition: Status is: Inpatient    Planned Discharge Destination:  Time spent: 40 minutes   Data Reviewed:   This x-ray obtained today reviewed that shows some linear opacity in the right base     Vitals:   01/30/24 1259 01/30/24 1358 01/30/24 1624 01/30/24 1627  BP:    (!) 146/67  Pulse:   76 78  Resp:   16   Temp:   98.1 F (36.7 C)   TempSrc:      SpO2: 90% 93% 91%   Weight:      Height:          Latest Ref Rng & Units 01/30/2024    4:34 AM 01/29/2024    5:58 AM 01/28/2024    6:22 AM  CBC  WBC 4.0 - 10.5 K/uL 8.7  9.2  8.2   Hemoglobin 12.0 - 15.0 g/dL 86.2  85.7  85.4   Hematocrit 36.0 - 46.0 % 41.8  42.8  45.2   Platelets 150 - 400 K/uL 187  169  178        Latest Ref Rng & Units 01/30/2024    4:34 AM 01/29/2024    5:58 AM 01/28/2024    6:22 AM  BMP  Glucose 70 - 99 mg/dL 889  892  96   BUN 8 - 23 mg/dL 10  11  12    Creatinine 0.44 - 1.00 mg/dL 9.51  9.52  9.46   Sodium 135 - 145 mmol/L 142  141  142   Potassium 3.5 - 5.1 mmol/L 3.9  3.9  4.0   Chloride 98 - 111 mmol/L 107  106  104   CO2 22 - 32 mmol/L 27  28  31    Calcium  8.9 - 10.3 mg/dL 9.1  8.9  9.0      Author: Drue ONEIDA Potter, MD 01/30/2024 5:24 PM  For on call review [email or web address].

## 2024-01-30 NOTE — Progress Notes (Signed)
 Occupational Therapy Treatment Patient Details Name: Jessica Leonard MRN: [id number] DOB: 1940-12-30 Today's Date: 01/30/2024   History of present illness Pt is an 83 y.o. female who presented to the ER with a calcium  of worsening dyspnea with associated cough and wheezing over the last week. Seen by her PCP on 6/12 and diagnosed with bronchitis. Admitted for management of sepsis d/t pneumonia, acute respiratory failure with hypoxia. PMH of anxiety, depression, chronic back pain, essential hypertension, and dyslipidemia   OT comments  Pt seen today for OT tx. OT provides education on energy conservation techniques and pt participates in functional STS transfers using RW, supervision - CGA provided for safety. Pt able to complete 10x reps with seated recovery breaks, cues for PLB techniques. SpO2 88% with mobility on 6L/min, at rest above 93%. Discharge recommendation remains appropriate, OT will continue to follow.       If plan is discharge home, recommend the following:  A little help with walking and/or transfers;A little help with bathing/dressing/bathroom   Equipment Recommendations  Other (comment)       Precautions / Restrictions Precautions Precautions: Fall Recall of Precautions/Restrictions: Intact Precaution/Restrictions Comments: Monitor SPO2% Restrictions Weight Bearing Restrictions Per Provider Order: No       Mobility Bed Mobility Overal bed mobility: Needs Assistance             General bed mobility comments: NT, pt in recliner pre and post arrival    Transfers Overall transfer level: Needs assistance Equipment used: Rolling walker (2 wheels)   Sit to Stand: Supervision           General transfer comment: used RW, good control, occassional cues to control descent     Balance Overall balance assessment: Needs assistance Sitting-balance support: Feet supported, Single extremity supported Sitting balance-Leahy Scale: Normal     Standing balance  support: Bilateral upper extremity supported, During functional activity Standing balance-Leahy Scale: Good                             ADL either performed or assessed with clinical judgement   ADL                                         General ADL Comments: ADLs not performed (pt states she has already performed grooming and bathing tasks earlier)     Communication Communication Communication: No apparent difficulties            Exercises Other Exercises Other Exercises: pt performed 10x functional STS transfers with cues for PLB       General Comments  On 6L/min O2. SpO2 88% with mobility, 93%> at rest    Pertinent Vitals/ Pain       Pain Assessment Pain Assessment: No/denies pain Faces Pain Scale: Hurts a little bit Pain Location: lower back Pain Descriptors / Indicators: Aching, Sore Pain Intervention(s): Limited activity within patient's tolerance, Monitored during session   Frequency  Min 2X/week        Progress Toward Goals  OT Goals(current goals can now be found in the care plan section)  Progress towards OT goals: Progressing toward goals  Acute Rehab OT Goals OT Goal Formulation: With patient/family Potential to Achieve Goals: Good  Plan         AM-PAC OT 6 Clicks Daily Activity     Outcome Measure  Help from another person eating meals?: None Help from another person taking care of personal grooming?: None Help from another person toileting, which includes using toliet, bedpan, or urinal?: A Little Help from another person bathing (including washing, rinsing, drying)?: A Little Help from another person to put on and taking off regular upper body clothing?: A Little Help from another person to put on and taking off regular lower body clothing?: A Little 6 Click Score: 20    End of Session Equipment Utilized During Treatment: Oxygen;Gait belt;Rolling walker (2 wheels)  OT Visit Diagnosis: Other  abnormalities of gait and mobility (R26.89);Muscle weakness (generalized) (M62.81)   Activity Tolerance Patient tolerated treatment well   Patient Left in chair;with chair alarm set;with call bell/phone within reach   Nurse Communication Mobility status        Time: 1542-1610 OT Time Calculation (min): 28 min  Charges: OT General Charges $OT Visit: 1 Visit OT Treatments $Therapeutic Activity: 23-37 mins Dontrell Stuck L. Fabricio Endsley, OTR/L  01/30/24, 4:35 PM

## 2024-01-31 DIAGNOSIS — A419 Sepsis, unspecified organism: Secondary | ICD-10-CM | POA: Diagnosis not present

## 2024-01-31 DIAGNOSIS — J189 Pneumonia, unspecified organism: Secondary | ICD-10-CM | POA: Diagnosis not present

## 2024-01-31 LAB — BASIC METABOLIC PANEL WITH GFR
Anion gap: 9 (ref 5–15)
BUN: 12 mg/dL (ref 8–23)
CO2: 28 mmol/L (ref 22–32)
Calcium: 9.3 mg/dL (ref 8.9–10.3)
Chloride: 106 mmol/L (ref 98–111)
Creatinine, Ser: 0.56 mg/dL (ref 0.44–1.00)
GFR, Estimated: >60 mL/min (ref >60)
Glucose, Bld: 97 mg/dL (ref 70–99)
Potassium: 3.7 mmol/L (ref 3.5–5.1)
Sodium: 143 mmol/L (ref 135–145)

## 2024-01-31 LAB — CBC WITH DIFFERENTIAL/PLATELET
Abs Immature Granulocytes: 0.03 10*3/uL (ref 0.00–0.07)
Basophils Absolute: 0 10*3/uL (ref 0.0–0.1)
Basophils Relative: 0 %
Eosinophils Absolute: 0 10*3/uL (ref 0.0–0.5)
Eosinophils Relative: 1 %
HCT: 42.4 % (ref 36.0–46.0)
Hemoglobin: 13.7 g/dL (ref 12.0–15.0)
Immature Granulocytes: 0 %
Lymphocytes Relative: 28 %
Lymphs Abs: 2.5 10*3/uL (ref 0.7–4.0)
MCH: 30.7 pg (ref 26.0–34.0)
MCHC: 32.3 g/dL (ref 30.0–36.0)
MCV: 95.1 fL (ref 80.0–100.0)
Monocytes Absolute: 0.9 10*3/uL (ref 0.1–1.0)
Monocytes Relative: 10 %
Neutro Abs: 5.3 10*3/uL (ref 1.7–7.7)
Neutrophils Relative %: 61 %
Platelets: 216 10*3/uL (ref 150–400)
RBC: 4.46 MIL/uL (ref 3.87–5.11)
RDW: 13.2 % (ref 11.5–15.5)
WBC: 8.8 10*3/uL (ref 4.0–10.5)
nRBC: 0 % (ref 0.0–0.2)

## 2024-01-31 LAB — C-REACTIVE PROTEIN: CRP: 1.5 mg/dL — ABNORMAL HIGH (ref <1.0)

## 2024-01-31 MED ORDER — SEMAGLUTIDE-WEIGHT MANAGEMENT 1.7 MG/0.75ML ~~LOC~~ SOAJ: 1.7000 mg | SUBCUTANEOUS | Status: DC

## 2024-01-31 NOTE — TOC Progression Note (Signed)
 Transition of Care [facility]) - Progression Note    Patient Details  Name: Jessica Leonard MRN: [id number] Date of Birth: 09-Nov-1940  [facility] CM/SW Contact  Dalia GORMAN Fuse, RN Phone Number: 01/31/2024, 4:17 PM  Clinical Narrative:    Patient remains on 5 LNC, may need Home O2 at discharge. Therapy recommending HH PT/OT. TOC will continue to follow.     Barriers to Discharge: Continued Medical Work up  Expected Discharge Plan and Services   Discharge Planning Services: CM Consult   Living arrangements for the past 2 months: Single Family Home                                       Social Determinants of Health (SDOH) Interventions SDOH Screenings   Food Insecurity: No Food Insecurity (01/26/2024)  Housing: Low Risk  (01/26/2024)  Transportation Needs: No Transportation Needs (01/26/2024)  Utilities: Not At Risk (01/26/2024)  Financial Resource Strain: Low Risk  (01/19/2024)   Received from [facility]  Social Connections: Moderately Isolated (01/26/2024)  Tobacco Use: Medium Risk (01/25/2024)    Readmission Risk Interventions     No data to display

## 2024-01-31 NOTE — Plan of Care (Signed)
 Patient remains free from any noted signs of acute distress.  Continues to require O2 use, currently at 6L, high flow.  Patient noted to tolerate current therapy.  Continues to have periods of productive cough.  No additional signs of acute respiratory distress noted.  Patient to continue to be monitored by hospital staff until discharged.

## 2024-01-31 NOTE — Progress Notes (Signed)
 Progress Note   Patient: Jessica Leonard FMW:[id number] DOB: 07/17/1941 DOA: 01/24/2024     6 DOS: the patient was seen and examined on 01/31/2024     Brief hospital course:   Jessica Leonard is a 83 y.o. female with medical history significant for anxiety, depression, chronic back pain, essential hypertension, and dyslipidemia who presented to the emergency room, presented to the emergency room with a calcium  of worsening dyspnea with associated cough and wheezing over the last week.  Patient subsequently admitted for acute hypoxemic respiratory failure due to community-acquired pneumonia.  Pulmonary following .       Assessment and Plan: Sepsis due to pneumonia (HCC) - Sepsis is manifested by tachycardia and tachypnea. Continue telemetry Continue intranasal oxygen Has completed 5 days course of ceftriaxone  and azithromycin  We will monitor respiratory function closely I appreciate the input of pulmonologist   Acute respiratory failure with hypoxia (HCC) - Secondary to community-acquired pneumonia - Management as above. - O2 protocol will be followed.   GERD without esophagitis Continue PPI therapy.   Anxiety and depression - Will continue Cymbalta  and Tofranil    Dyslipidemia - Will continue statin therapy.   Essential hypertension - Continue antihypertensive therapy.     DVT prophylaxis: Lovenox .    Advanced Care Planning:  Code Status: full code.    Disposition Plan: Back to previous home environment   Subjective:  Respiratory function have improved to 4 L today She admits to improvement in respiratory function as well as denies nausea vomiting or abdominal pain   Physical Exam:   GENERAL: Elderly female laying in bed in no acute distress EYES: Pupils equal, round, reactive to light and accommodation. No scleral icterus. Extraocular muscles intact.  HEENT: Head atraumatic, normocephalic. Oropharynx and nasopharynx clear.  NECK:  Supple, no jugular venous  distention. No thyroid enlargement, no tenderness.  LUNGS: Decreased air entry bibasilarly CARDIOVASCULAR: Regular rate and rhythm, S1, S2 normal. No murmurs, rubs, or gallops.  ABDOMEN: Soft, nondistended, nontender. Bowel sounds present. No organomegaly or mass.  EXTREMITIES: No pedal edema, cyanosis, or clubbing.  NEUROLOGIC: Cranial nerves II through XII are intact. Muscle strength 5/5 in all extremities. Sensation intact. Gait not checked.  PSYCHIATRIC: The patient is alert and oriented x 3.  Normal affect and good eye contact. SKIN: No obvious rash, lesion, or ulcer.    Family Communication: None at bedside   Disposition: Patient will be discharged home with home health when oxygen requirement improves  Status is: Inpatient    Planned Discharge Destination:  Time spent: 42  minutes   Data Reviewed:   Vitals:   01/31/24 1030 01/31/24 1033 01/31/24 1432 01/31/24 1643  BP:    (!) 116/90  Pulse:    81  Resp:    18  Temp:    98 F (36.7 C)  TempSrc:      SpO2: (!) 84% 90% 91% 91%  Weight:      Height:          Latest Ref Rng & Units 01/31/2024    3:46 AM 01/30/2024    4:34 AM 01/29/2024    5:58 AM  CBC  WBC 4.0 - 10.5 K/uL 8.8  8.7  9.2   Hemoglobin 12.0 - 15.0 g/dL 86.2  86.2  85.7   Hematocrit 36.0 - 46.0 % 42.4  41.8  42.8   Platelets 150 - 400 K/uL 216  187  169        Latest Ref Rng & Units 01/31/2024  3:46 AM 01/30/2024    4:34 AM 01/29/2024    5:58 AM  BMP  Glucose 70 - 99 mg/dL 97  889  892   BUN 8 - 23 mg/dL 12  10  11    Creatinine 0.44 - 1.00 mg/dL 9.43  9.51  9.52   Sodium 135 - 145 mmol/L 143  142  141   Potassium 3.5 - 5.1 mmol/L 3.7  3.9  3.9   Chloride 98 - 111 mmol/L 106  107  106   CO2 22 - 32 mmol/L 28  27  28    Calcium  8.9 - 10.3 mg/dL 9.3  9.1  8.9      Author: Drue ONEIDA Potter, MD 01/31/2024 5:03 PM  For on call review [email or web address].

## 2024-01-31 NOTE — Progress Notes (Signed)
 PULMONOLOGY         Date: 01/31/2024,   MRN# [id number] Jessica Leonard 08-Aug-1941     AdmissionWeight: 94.3 kg                 CurrentWeight: 94.3 kg  Referring provider: Dr Dorinda   CHIEF COMPLAINT:   Progressive dyspnea with acute on chronic hypoxemic respiratory failure   HISTORY OF PRESENT ILLNESS   This is an 83 yo F with hx of MDD, lumbago, dyslipidemia, HTN, came in with worsening dyspnea and wheezing over 1-2 week period of time. Patient denies febrile illness, but does admit to having myalgias, subjective chills and was found to be hypoxemic in ER with lowest spO2 at 75%.  She had outpatient failure with treatment of LRTI after completing course of doxycycline and prednisone.  She was also hypertensive and blood work revealed hypokalemia and chest imaging was performed with RLL opacification suggestive of CAP.  Despite 2d of therapy she continues to have hypoxememia and PCCM consult placed for additional evaluation and management. Ive reviewed her CT chest there appears to be elevation of right hemidiagphragm and to a lesser degree left side.    01/29/24- patient had large BM and feels better.  She remains on 15L/min, however on examination the nasal canula is not in her nose but hanging off upper lip and patient reports left nare is obstructed.  She previously was seen by ENT but does not recall what her diagnosis was.  Ive discussed this with patient and family at bedside including daughter and son in law. Further after consulting with RT we will change interface to mask and see if patient will be weaned better since she primary is mouth breathing. She did participate with metaneb. Repeat CXR with persistent atelectatic changes but right lower lobe is improved.   01/31/24- patient seen at bedside.  She is with family visiting today. She is smiling now and is reporting improved respiratory status. Her O2 req is weaned from 15L/min to 6L/min.  Bloodwork is improved.  She  remains on solumedrol 40IV daily   PAST MEDICAL HISTORY   Past Medical History:  Diagnosis Date   Anxiety    Complication of anesthesia    hard time waking up after hysterectomy   Depression    Epigastric pain    Hypertension      SURGICAL HISTORY   Past Surgical History:  Procedure Laterality Date   ABDOMINAL HYSTERECTOMY     APPENDECTOMY     back fusion     BACK SURGERY     BUNIONECTOMY     cataracts     COLONOSCOPY WITH PROPOFOL  N/A 10/08/2020   Procedure: COLONOSCOPY WITH PROPOFOL ;  Surgeon: Unk Corinn Skiff, MD;  Location: [facility] ENDOSCOPY;  Service: Gastroenterology;  Laterality: N/A;   ESOPHAGOGASTRODUODENOSCOPY (EGD) WITH PROPOFOL  N/A 01/15/2020   Procedure: ESOPHAGOGASTRODUODENOSCOPY (EGD) WITH PROPOFOL ;  Surgeon: Unk Corinn Skiff, MD;  Location: [facility] ENDOSCOPY;  Service: Gastroenterology;  Laterality: N/A;   ESOPHAGOGASTRODUODENOSCOPY (EGD) WITH PROPOFOL  N/A 10/08/2020   Procedure: ESOPHAGOGASTRODUODENOSCOPY (EGD) WITH PROPOFOL ;  Surgeon: Unk Corinn Skiff, MD;  Location: [facility] ENDOSCOPY;  Service: Gastroenterology;  Laterality: N/A;   EYE SURGERY     INCONTINENCE SURGERY     TOTAL SHOULDER ARTHROPLASTY Bilateral      FAMILY HISTORY   Family History  Problem Relation Age of Onset   Diabetes Mellitus II Father      SOCIAL HISTORY   Social History   Tobacco Use  Smoking status: Former    Current packs/day: 0.00    Types: Cigarettes    Quit date: 2011    Years since quitting: 14.4   Smokeless tobacco: Never  Vaping Use   Vaping status: Never Used  Substance Use Topics   Alcohol use: Not Currently   Drug use: Never     MEDICATIONS    Home Medication:    Current Medication:  Current Facility-Administered Medications:    acetaminophen  (TYLENOL ) tablet 650 mg, 650 mg, Oral, Q6H PRN, 650 mg at 01/30/24 0824 **OR** acetaminophen  (TYLENOL ) suppository 650 mg, 650 mg, Rectal, Q6H PRN, Mansy, Jan A, MD   artificial tears ophthalmic solution 1-2  drop, 1-2 drop, Both Eyes, PRN, Djan, Drue DASEN, MD   atorvastatin  (LIPITOR) tablet 20 mg, 20 mg, Oral, QHS, Djan, Prince T, MD, 20 mg at 01/30/24 2142   calcium -vitamin D  (OSCAL WITH D) 500-5 MG-MCG per tablet 1 tablet, 1 tablet, Oral, BID WC, Dorinda Drue DASEN, MD, 1 tablet at 01/31/24 0820   cholecalciferol  (VITAMIN D3) 25 MCG (1000 UNIT) tablet 1,000 Units, 1,000 Units, Oral, Daily, Djan, Drue DASEN, MD, 1,000 Units at 01/31/24 0820   cyanocobalamin  (VITAMIN B12) tablet 500 mcg, 500 mcg, Oral, Daily, Djan, Prince T, MD, 500 mcg at 01/31/24 0820   DULoxetine  (CYMBALTA ) DR capsule 60 mg, 60 mg, Oral, Daily, Djan, Prince T, MD, 60 mg at 01/31/24 0820   enoxaparin  (LOVENOX ) injection 50 mg, 50 mg, Subcutaneous, Q24H, Meegan, Eryn, RPH, 50 mg at 01/31/24 9178   fluticasone  (FLONASE ) 50 MCG/ACT nasal spray 1 spray, 1 spray, Each Nare, Daily, Djan, Drue DASEN, MD, 1 spray at 01/31/24 9178   guaiFENesin  (MUCINEX ) 12 hr tablet 600 mg, 600 mg, Oral, BID, Mansy, Jan A, MD, 600 mg at 01/31/24 0820   ipratropium-albuterol  (DUONEB) 0.5-2.5 (3) MG/3ML nebulizer solution 3 mL, 3 mL, Nebulization, TID, Alexzavier Girardin, MD, 3 mL at 01/31/24 1432   lactulose  (CHRONULAC ) 10 GM/15ML solution 20 g, 20 g, Oral, Daily, Erlin Gardella, MD   magnesium hydroxide (MILK OF MAGNESIA) suspension 30 mL, 30 mL, Oral, Daily PRN, Mansy, Jan A, MD   methylPREDNISolone sodium succinate (SOLU-MEDROL) 40 mg/mL injection 40 mg, 40 mg, Intravenous, Daily, Ralston Venus, MD, 40 mg at 01/31/24 0820   multivitamin (PROSIGHT) tablet 1 tablet, 1 tablet, Oral, BID, Niels Kayla FALCON, RPH, 1 tablet at 01/31/24 0820   NIFEdipine  (ADALAT  CC) 24 hr tablet 60 mg, 60 mg, Oral, Daily, Djan, Prince T, MD, 60 mg at 01/31/24 0820   ondansetron  (ZOFRAN ) tablet 4 mg, 4 mg, Oral, Q6H PRN **OR** ondansetron  (ZOFRAN ) injection 4 mg, 4 mg, Intravenous, Q6H PRN, Mansy, Jan A, MD   pantoprazole  (PROTONIX ) EC tablet 40 mg, 40 mg, Oral, Daily, Jesus America, NP,  40 mg at 01/31/24 0820   traZODone (DESYREL) tablet 25 mg, 25 mg, Oral, QHS PRN, Mansy, Jan A, MD    ALLERGIES   Sulfa antibiotics, Amoxicillin, Codeine, Clindamycin, Nsaids, and Other     REVIEW OF SYSTEMS    Review of Systems:  Gen:  Denies  fever, sweats, chills weigh loss  HEENT: Denies blurred vision, double vision, ear pain, eye pain, hearing loss, nose bleeds, sore throat Cardiac:  No dizziness, chest pain or heaviness, chest tightness,edema Resp:   reports dyspnea chronically  Gi: Denies swallowing difficulty, stomach pain, nausea or vomiting, diarrhea, constipation, bowel incontinence Gu:  Denies bladder incontinence, burning urine Ext:   Denies Joint pain, stiffness or swelling Skin: Denies  skin rash, easy bruising  or bleeding or hives Endoc:  Denies polyuria, polydipsia , polyphagia or weight change Psych:   Denies depression, insomnia or hallucinations   Other:  All other systems negative   VS: BP (!) 141/79 (BP Location: Left Arm)   Pulse 73   Temp 98.4 F (36.9 C)   Resp 20   Ht 5' 2 (1.575 m)   Wt 94.3 kg   SpO2 91%   BMI 38.04 kg/m      PHYSICAL EXAM    GENERAL:NAD, no fevers, chills, no weakness no fatigue HEAD: Normocephalic, atraumatic.  EYES: Pupils equal, round, reactive to light. Extraocular muscles intact. No scleral icterus.  MOUTH: Moist mucosal membrane. Dentition intact. No abscess noted.  EAR, NOSE, THROAT: Clear without exudates. No external lesions.  NECK: Supple. No thyromegaly. No nodules. No JVD.  PULMONARY: decreased breath sounds with mild rhonchi worse at bases bilaterally.  CARDIOVASCULAR: S1 and S2. Regular rate and rhythm. No murmurs, rubs, or gallops. No edema. Pedal pulses 2+ bilaterally.  GASTROINTESTINAL: Soft, nontender, nondistended. No masses. Positive bowel sounds. No hepatosplenomegaly.  MUSCULOSKELETAL: No swelling, clubbing, or edema. Range of motion full in all extremities.  NEUROLOGIC: Cranial nerves II  through XII are intact. No gross focal neurological deficits. Sensation intact. Reflexes intact.  SKIN: No ulceration, lesions, rashes, or cyanosis. Skin warm and dry. Turgor intact.  PSYCHIATRIC: Mood, affect within normal limits. The patient is awake, alert and oriented x 3. Insight, judgment intact.       IMAGING     ASSESSMENT/PLAN   Acute hypoxemic respiratory failure   - right and left hemidiaphragm elevation    - atelectasis    - will obtain respiratory viral panel and CRP trend   - continue therapy for community acquired pneumonia currently on rocephin  zithromax     -oxygen requirement will improve within 24 hours and should be weaned for goal Spo2 >88%   -currently req  Bilateral hemidiaphragm elevation    - due to obese body habitus   - improved from yesterday post large BM via enema  Bibasilar atelectasis   -patient should use incentive spirometry when able   - will order metaneb with albuterol  with RT   - PT and OT             Thank you for allowing me to participate in the care of this patient.   Patient/Family are satisfied with care plan and all questions have been answered.    Provider disclosure: Patient with at least one acute or chronic illness or injury that poses a threat to life or bodily function and is being managed actively during this encounter.  All of the below services have been performed independently by signing provider:  review of prior documentation from internal and or external health records.  Review of previous and current lab results.  Interview and comprehensive assessment during patient visit today. Review of current and previous chest radiographs/CT scans. Discussion of management and test interpretation with health care team and patient/family.   This document was prepared using Dragon voice recognition software and may include unintentional dictation errors.     Alven Alverio, M.D.  Division of Pulmonary & Critical Care  Medicine

## 2024-01-31 NOTE — Plan of Care (Signed)
  Problem: Respiratory: Goal: Ability to maintain adequate ventilation will improve Outcome: Progressing   Problem: Education: Goal: Knowledge of General Education information will improve Description: Including pain rating scale, medication(s)/side effects and non-pharmacologic comfort measures Outcome: Progressing   Problem: Nutrition: Goal: Adequate nutrition will be maintained Outcome: Progressing   Problem: Coping: Goal: Level of anxiety will decrease Outcome: Progressing   Problem: Pain Managment: Goal: General experience of comfort will improve and/or be controlled Outcome: Progressing

## 2024-01-31 NOTE — Progress Notes (Signed)
 Physical Therapy Treatment Patient Details Name: Jessica Leonard MRN: [id number] DOB: 1941/06/23 Today's Date: 01/31/2024   History of Present Illness Pt is an 83 y.o. female who presented to the ER with a calcium  of worsening dyspnea with associated cough and wheezing over the last week. Seen by her PCP on 6/12 and diagnosed with bronchitis. Admitted for management of sepsis d/t pneumonia, acute respiratory failure with hypoxia. PMH of anxiety, depression, chronic back pain, essential hypertension, and dyslipidemia    PT Comments  Pt was pleasant and motivated to participate during the session and put forth good effort throughout. Pt's SPO2% was 88% on 5L/min upon PT arrival. Pt completed supine therex per below and VC's were provided to count reps out loud throughout for proper breathing technique. When pt came sitting EOB, pt was placed on 6L/min due to oxygen tank not having 5L/min option. When sitting EOB, pt's SPO2% was 87% on 6L/min. Pt ambulated 5 feet x 3 with RW with supervision and use of oxygen tank. After each bout of ambulation, pt's SPO2% decreased to 83% on 6L/min, which then increased to 88% after 1.5 minutes of sitting. Pt will benefit from continued PT services upon discharge to safely address deficits listed in patient problem list for decreased caregiver assistance and eventual return to PLOF.       If plan is discharge home, recommend the following: A little help with walking and/or transfers;Assistance with cooking/housework;Assist for transportation;Help with stairs or ramp for entrance;A little help with bathing/dressing/bathroom   Can travel by private vehicle        Equipment Recommendations  None recommended by PT    Recommendations for Other Services       Precautions / Restrictions Precautions Precautions: Fall Recall of Precautions/Restrictions: Intact Precaution/Restrictions Comments: Monitor SPO2% Restrictions Weight Bearing Restrictions Per Provider  Order: No     Mobility  Bed Mobility Overal bed mobility: Needs Assistance Bed Mobility: Supine to Sit     Supine to sit: Supervision     General bed mobility comments: Pt required supervision for coming to sitting EOB from supine, but did not use bedrails or needed physical assistance    Transfers Overall transfer level: Needs assistance Equipment used: Rolling walker (2 wheels) Transfers: Bed to chair/wheelchair/BSC Sit to Stand: Supervision           General transfer comment: Pt used RW to transfer from EOB to chair, remained steady throughout and no LOBs occured    Ambulation/Gait Ambulation/Gait assistance: Supervision Gait Distance (Feet): 5 Feet x 3 Assistive device: Rolling walker (2 wheels) Gait Pattern/deviations: Step-through pattern, Trunk flexed, Decreased step length - right, Decreased step length - left Gait velocity: decreased     General Gait Details: Pt ambulated with RW with slow cadence and trunk flexed over RW, but remained steady and no LOBs occured; ambulation distance limited by this PT to 5 feet secondary to drop in SPO2%.   Stairs             Wheelchair Mobility     Tilt Bed    Modified Rankin (Stroke Patients Only)       Balance Overall balance assessment: Needs assistance Sitting-balance support: No upper extremity supported, Feet supported Sitting balance-Leahy Scale: Normal     Standing balance support: Bilateral upper extremity supported, During functional activity Standing balance-Leahy Scale: Good Standing balance comment: Pt flexed trunk over RW during stance and ambulation, but remained steady throughout  Communication Communication Communication: No apparent difficulties  Cognition Arousal: Alert Behavior During Therapy: WFL for tasks assessed/performed   PT - Cognitive impairments: No apparent impairments                         Following commands: Intact       Cueing Cueing Techniques: Verbal cues, Gestural cues  Exercises Total Joint Exercises Quad Sets: Strengthening, Both, 10 reps Straight Leg Raises: Strengthening, Both, 10 reps VC's were provided to count reps out loud throughout for proper breathing technique.     General Comments        Pertinent Vitals/Pain Pain Assessment Pain Assessment: No/denies pain    Home Living                          Prior Function            PT Goals (current goals can now be found in the care plan section) Progress towards PT goals: Progressing toward goals    Frequency    Min 2X/week      PT Plan      Co-evaluation              AM-PAC PT 6 Clicks Mobility   Outcome Measure  Help needed turning from your back to your side while in a flat bed without using bedrails?: None Help needed moving from lying on your back to sitting on the side of a flat bed without using bedrails?: A Little Help needed moving to and from a bed to a chair (including a wheelchair)?: A Little Help needed standing up from a chair using your arms (e.g., wheelchair or bedside chair)?: A Little Help needed to walk in hospital room?: A Little Help needed climbing 3-5 steps with a railing? : A Little 6 Click Score: 19    End of Session Equipment Utilized During Treatment: Gait belt;Oxygen Activity Tolerance: Patient tolerated treatment well Patient left: in chair;with call bell/phone within reach;with chair alarm set;with family/visitor present Nurse Communication: Mobility status PT Visit Diagnosis: Unsteadiness on feet (R26.81);Other abnormalities of gait and mobility (R26.89);Repeated falls (R29.6)     Time: 8887-8849 PT Time Calculation (min) (ACUTE ONLY): 38 min  Charges:                            Leontine Ingles, SPT 01/31/24, 1:57 PM

## 2024-02-01 ENCOUNTER — Inpatient Hospital Stay

## 2024-02-01 DIAGNOSIS — J9601 Acute respiratory failure with hypoxia: Secondary | ICD-10-CM | POA: Diagnosis not present

## 2024-02-01 DIAGNOSIS — J189 Pneumonia, unspecified organism: Secondary | ICD-10-CM | POA: Diagnosis not present

## 2024-02-01 DIAGNOSIS — I10 Essential (primary) hypertension: Secondary | ICD-10-CM | POA: Diagnosis not present

## 2024-02-01 DIAGNOSIS — A419 Sepsis, unspecified organism: Secondary | ICD-10-CM | POA: Diagnosis not present

## 2024-02-01 LAB — BASIC METABOLIC PANEL WITH GFR
Anion gap: 9 (ref 5–15)
BUN: 17 mg/dL (ref 8–23)
CO2: 27 mmol/L (ref 22–32)
Calcium: 9.3 mg/dL (ref 8.9–10.3)
Chloride: 106 mmol/L (ref 98–111)
Creatinine, Ser: 0.55 mg/dL (ref 0.44–1.00)
GFR, Estimated: >60 mL/min (ref >60)
Glucose, Bld: 96 mg/dL (ref 70–99)
Potassium: 3.5 mmol/L (ref 3.5–5.1)
Sodium: 142 mmol/L (ref 135–145)

## 2024-02-01 LAB — CBC WITH DIFFERENTIAL/PLATELET
Abs Immature Granulocytes: 0.03 10*3/uL (ref 0.00–0.07)
Basophils Absolute: 0 10*3/uL (ref 0.0–0.1)
Basophils Relative: 1 %
Eosinophils Absolute: 0 10*3/uL (ref 0.0–0.5)
Eosinophils Relative: 1 %
HCT: 42.2 % (ref 36.0–46.0)
Hemoglobin: 13.8 g/dL (ref 12.0–15.0)
Immature Granulocytes: 0 %
Lymphocytes Relative: 31 %
Lymphs Abs: 2.6 10*3/uL (ref 0.7–4.0)
MCH: 31.4 pg (ref 26.0–34.0)
MCHC: 32.7 g/dL (ref 30.0–36.0)
MCV: 95.9 fL (ref 80.0–100.0)
Monocytes Absolute: 0.7 10*3/uL (ref 0.1–1.0)
Monocytes Relative: 8 %
Neutro Abs: 5 10*3/uL (ref 1.7–7.7)
Neutrophils Relative %: 59 %
Platelets: 217 10*3/uL (ref 150–400)
RBC: 4.4 MIL/uL (ref 3.87–5.11)
RDW: 13.2 % (ref 11.5–15.5)
WBC: 8.3 10*3/uL (ref 4.0–10.5)
nRBC: 0 % (ref 0.0–0.2)

## 2024-02-01 LAB — C-REACTIVE PROTEIN: CRP: 0.8 mg/dL (ref <1.0)

## 2024-02-01 LAB — BRAIN NATRIURETIC PEPTIDE: B Natriuretic Peptide: 18.7 pg/mL (ref 0.0–100.0)

## 2024-02-01 MED ORDER — PREDNISONE 10 MG PO TABS: 10.0000 mg | ORAL_TABLET | Freq: Every day | ORAL | Status: DC

## 2024-02-01 MED ORDER — PREDNISONE 50 MG PO TABS: 50.0000 mg | ORAL_TABLET | Freq: Every day | ORAL | Status: DC

## 2024-02-01 MED ORDER — PREDNISONE 20 MG PO TABS: 40.0000 mg | ORAL_TABLET | Freq: Every day | ORAL | Status: DC

## 2024-02-01 MED ORDER — PREDNISONE 50 MG PO TABS
45.0000 mg | ORAL_TABLET | Freq: Every day | ORAL | Status: AC
  Administered 2024-02-03: 45 mg via ORAL
  Filled 2024-02-01: qty 1

## 2024-02-01 MED ORDER — PREDNISONE 50 MG PO TABS: 25.0000 mg | ORAL_TABLET | Freq: Every day | ORAL | Status: DC

## 2024-02-01 MED ORDER — PREDNISONE 20 MG PO TABS: 20.0000 mg | ORAL_TABLET | Freq: Every day | ORAL | Status: DC

## 2024-02-01 MED ORDER — PREDNISONE 50 MG PO TABS: 35.0000 mg | ORAL_TABLET | Freq: Every day | ORAL | Status: DC

## 2024-02-01 MED ORDER — PREDNISONE 10 MG PO TABS: 15.0000 mg | ORAL_TABLET | Freq: Every day | ORAL | Status: DC

## 2024-02-01 MED ORDER — PREDNISONE 10 MG PO TABS: 5.0000 mg | ORAL_TABLET | Freq: Every day | ORAL | Status: DC

## 2024-02-01 MED ORDER — PREDNISONE 50 MG PO TABS
50.0000 mg | ORAL_TABLET | Freq: Every day | ORAL | Status: AC
  Administered 2024-02-02: 50 mg via ORAL
  Filled 2024-02-01: qty 1

## 2024-02-01 MED ORDER — PREDNISONE 20 MG PO TABS: 30.0000 mg | ORAL_TABLET | Freq: Every day | ORAL | Status: DC

## 2024-02-01 NOTE — Plan of Care (Signed)
 Patient remains free from any noted signs of acute distress.  Continues to have O2 in use, currently at 5L, via [place], continuous.  Remains free from any noted signs of acute respiratory distress.  No changes in current medications.  Patient to continue to be monitored by hospital staff until discharged.

## 2024-02-01 NOTE — Hospital Course (Signed)
 Jessica Leonard is a 83 y.o. female with medical history significant for anxiety, depression, chronic back pain, essential hypertension, and dyslipidemia who presented to the emergency room, presented to the emergency room with a calcium  of worsening dyspnea with associated cough and wheezing over the last week.  Patient subsequently admitted for acute hypoxemic respiratory failure due to community-acquired pneumonia.

## 2024-02-01 NOTE — Progress Notes (Signed)
  Progress Note   Patient: Jessica Leonard FMW:[id number] DOB: 07-02-1941 DOA: 01/24/2024     7 DOS: the patient was seen and examined on 02/01/2024   Brief hospital course: Jessica Leonard is a 83 y.o. female with medical history significant for anxiety, depression, chronic back pain, essential hypertension, and dyslipidemia who presented to the emergency room, presented to the emergency room with a calcium  of worsening dyspnea with associated cough and wheezing over the last week.  Patient subsequently admitted for acute hypoxemic respiratory failure due to community-acquired pneumonia.    Principal Problem:   Sepsis due to pneumonia [facility]) Active Problems:   Acute respiratory failure with hypoxia (HCC)   Essential hypertension   Dyslipidemia   Anxiety and depression   GERD without esophagitis   Acute hypoxic respiratory failure (HCC)   Assessment and Plan: Sepsis due to pneumonia (HCC) Acute respite failure with hypoxemia secondary to pneumonia. Obstructive sleep apnea - Sepsis is manifested by tachycardia and tachypnea. Has completed 5 days course of ceftriaxone  and azithromycin . Patient still requiring 3 L oxygen.  Based on her chest CT scan, she has a minimal pneumonia, procalcitonin level less than 0.1.  Hypoxemia seem to be disproportional to the amount of pneumonia.  She does not have any history of COPD, non-smoker.  BNP was normal. I suspect the patient had a viral pneumonia on top of obstructive sleep apnea. Patient refused any CPAP, but will obtain overnight oximetry to see if patient need nocturnal oxygen. Patient is also placed on prednisone taper per pulmonology.  Obesity. BMI will be calculated.  Diet and exercise advised.  GERD without esophagitis Continue PPI therapy.   Anxiety and depression - Will continue Cymbalta  and Tofranil    Dyslipidemia - Will continue statin therapy.   Essential hypertension - Continue antihypertensive  therapy.      Subjective:  Patient doing much better today, still has reduced oxygen.  Some short of breath with exertion.  Physical Exam: Vitals:   01/31/24 2019 01/31/24 2024 02/01/24 0411 02/01/24 0743  BP: 127/76  109/73 (!) 148/74  Pulse: 79  79 71  Resp:    19  Temp: 98.3 F (36.8 C)  97.7 F (36.5 C) 97.6 F (36.4 C)  TempSrc:      SpO2: 93% 90% (!) 89% 98%  Weight:      Height:       General exam: Appears calm and comfortable, obesity Respiratory system: Clear to auscultation. Respiratory effort normal. Cardiovascular system: S1 & S2 heard, RRR. No JVD, murmurs, rubs, gallops or clicks. No pedal edema. Gastrointestinal system: Abdomen is nondistended, soft and nontender. No organomegaly or masses felt. Normal bowel sounds heard. Central nervous system: Alert and oriented. No focal neurological deficits. Extremities: Symmetric 5 x 5 power. Skin: No rashes, lesions or ulcers Psychiatry: Judgement and insight appear normal. Mood & affect appropriate.    Data Reviewed:  Lab results reviewed.  CT scan results reviewed.  CT imaging reviewed.  Family Communication: None  Disposition: Status is: Inpatient Remains inpatient appropriate because: Severity of disease.     Time spent: 35 minutes  Author: Murvin Mana, MD 02/01/2024 4:19 PM  For on call review [email or web address].

## 2024-02-01 NOTE — Progress Notes (Signed)
 PULMONOLOGY         Date: 02/01/2024,   MRN# [id number] Jessica Leonard 1940/11/23     AdmissionWeight: 94.3 kg                 CurrentWeight: 94.3 kg  Referring provider: Dr Dorinda   CHIEF COMPLAINT:   Progressive dyspnea with acute on chronic hypoxemic respiratory failure   HISTORY OF PRESENT ILLNESS   This is an 83 yo F with hx of MDD, lumbago, dyslipidemia, HTN, came in with worsening dyspnea and wheezing over 1-2 week period of time. Patient denies febrile illness, but does admit to having myalgias, subjective chills and was found to be hypoxemic in ER with lowest spO2 at 75%.  She had outpatient failure with treatment of LRTI after completing course of doxycycline and prednisone.  She was also hypertensive and blood work revealed hypokalemia and chest imaging was performed with RLL opacification suggestive of CAP.  Despite 2d of therapy she continues to have hypoxememia and PCCM consult placed for additional evaluation and management. Ive reviewed her CT chest there appears to be elevation of right hemidiagphragm and to a lesser degree left side.    01/29/24- patient had large BM and feels better.  She remains on 15L/min, however on examination the nasal canula is not in her nose but hanging off upper lip and patient reports left nare is obstructed.  She previously was seen by ENT but does not recall what her diagnosis was.  Ive discussed this with patient and family at bedside including daughter and son in law. Further after consulting with RT we will change interface to mask and see if patient will be weaned better since she primary is mouth breathing. She did participate with metaneb. Repeat CXR with persistent atelectatic changes but right lower lobe is improved.   01/31/24- patient seen at bedside.  She is with family visiting today. She is smiling now and is reporting improved respiratory status. Her O2 req is weaned from 15L/min to 6L/min.  Bloodwork is improved.  She  remains on solumedrol 40IV daily   02/01/24- patient sitting up in chair eating meal during my visit.  She has friend visiting today.  She is now on 3L/min [place].  She states CPAP has been ordered for her at bedtime but she does not wish to use it.  She feels much improved. Starting tommorow we can wean steroids with prednisone 50mg  taper. She is getting close to her baseline. Will repeat CXR today  PAST MEDICAL HISTORY   Past Medical History:  Diagnosis Date   Anxiety    Complication of anesthesia    hard time waking up after hysterectomy   Depression    Epigastric pain    Hypertension      SURGICAL HISTORY   Past Surgical History:  Procedure Laterality Date   ABDOMINAL HYSTERECTOMY     APPENDECTOMY     back fusion     BACK SURGERY     BUNIONECTOMY     cataracts     COLONOSCOPY WITH PROPOFOL  N/A 10/08/2020   Procedure: COLONOSCOPY WITH PROPOFOL ;  Surgeon: Unk Corinn Skiff, MD;  Location: [facility] ENDOSCOPY;  Service: Gastroenterology;  Laterality: N/A;   ESOPHAGOGASTRODUODENOSCOPY (EGD) WITH PROPOFOL  N/A 01/15/2020   Procedure: ESOPHAGOGASTRODUODENOSCOPY (EGD) WITH PROPOFOL ;  Surgeon: Unk Corinn Skiff, MD;  Location: [facility] ENDOSCOPY;  Service: Gastroenterology;  Laterality: N/A;   ESOPHAGOGASTRODUODENOSCOPY (EGD) WITH PROPOFOL  N/A 10/08/2020   Procedure: ESOPHAGOGASTRODUODENOSCOPY (EGD) WITH PROPOFOL ;  Surgeon: Unk,  Corinn Skiff, MD;  Location: [facility] ENDOSCOPY;  Service: Gastroenterology;  Laterality: N/A;   EYE SURGERY     INCONTINENCE SURGERY     TOTAL SHOULDER ARTHROPLASTY Bilateral      FAMILY HISTORY   Family History  Problem Relation Age of Onset   Diabetes Mellitus II Father      SOCIAL HISTORY   Social History   Tobacco Use   Smoking status: Former    Current packs/day: 0.00    Types: Cigarettes    Quit date: 2011    Years since quitting: 14.4   Smokeless tobacco: Never  Vaping Use   Vaping status: Never Used  Substance Use Topics   Alcohol use: Not  Currently   Drug use: Never     MEDICATIONS    Home Medication:    Current Medication:  Current Facility-Administered Medications:    acetaminophen  (TYLENOL ) tablet 650 mg, 650 mg, Oral, Q6H PRN, 650 mg at 02/01/24 1206 **OR** acetaminophen  (TYLENOL ) suppository 650 mg, 650 mg, Rectal, Q6H PRN, Mansy, Jan A, MD   artificial tears ophthalmic solution 1-2 drop, 1-2 drop, Both Eyes, PRN, Djan, Drue DASEN, MD   atorvastatin  (LIPITOR) tablet 20 mg, 20 mg, Oral, QHS, Djan, Prince T, MD, 20 mg at 01/31/24 2109   calcium -vitamin D  (OSCAL WITH D) 500-5 MG-MCG per tablet 1 tablet, 1 tablet, Oral, BID WC, Djan, Prince T, MD, 1 tablet at 02/01/24 0950   cholecalciferol  (VITAMIN D3) 25 MCG (1000 UNIT) tablet 1,000 Units, 1,000 Units, Oral, Daily, Djan, Drue DASEN, MD, 1,000 Units at 02/01/24 0949   cyanocobalamin  (VITAMIN B12) tablet 500 mcg, 500 mcg, Oral, Daily, Djan, Prince T, MD, 500 mcg at 02/01/24 0951   DULoxetine  (CYMBALTA ) DR capsule 60 mg, 60 mg, Oral, Daily, Djan, Prince T, MD, 60 mg at 02/01/24 0949   enoxaparin  (LOVENOX ) injection 50 mg, 50 mg, Subcutaneous, Q24H, Meegan, Eryn, RPH, 50 mg at 02/01/24 0949   fluticasone  (FLONASE ) 50 MCG/ACT nasal spray 1 spray, 1 spray, Each Nare, Daily, Djan, Drue DASEN, MD, 1 spray at 02/01/24 9051   guaiFENesin  (MUCINEX ) 12 hr tablet 600 mg, 600 mg, Oral, BID, Mansy, Jan A, MD, 600 mg at 02/01/24 9050   ipratropium-albuterol  (DUONEB) 0.5-2.5 (3) MG/3ML nebulizer solution 3 mL, 3 mL, Nebulization, TID, Vung Kush, MD, 3 mL at 01/31/24 2023   lactulose  (CHRONULAC ) 10 GM/15ML solution 20 g, 20 g, Oral, Daily, Latriece Anstine, MD, 20 g at 02/01/24 0951   magnesium hydroxide (MILK OF MAGNESIA) suspension 30 mL, 30 mL, Oral, Daily PRN, Mansy, Jan A, MD   multivitamin (PROSIGHT) tablet 1 tablet, 1 tablet, Oral, BID, Niels Kayla FALCON, RPH, 1 tablet at 02/01/24 9051   NIFEdipine  (ADALAT  CC) 24 hr tablet 60 mg, 60 mg, Oral, Daily, Djan, Prince T, MD, 60 mg at  02/01/24 0950   ondansetron  (ZOFRAN ) tablet 4 mg, 4 mg, Oral, Q6H PRN **OR** ondansetron  (ZOFRAN ) injection 4 mg, 4 mg, Intravenous, Q6H PRN, Mansy, Jan A, MD   pantoprazole  (PROTONIX ) EC tablet 40 mg, 40 mg, Oral, Daily, Jesus America, NP, 40 mg at 02/01/24 0949   [START ON 02/02/2024] predniSONE (DELTASONE) tablet 50 mg, 50 mg, Oral, Q breakfast, Rashea Hoskie, MD   traZODone (DESYREL) tablet 25 mg, 25 mg, Oral, QHS PRN, Mansy, Jan A, MD    ALLERGIES   Sulfa antibiotics, Amoxicillin, Codeine, Clindamycin, Nsaids, and Other     REVIEW OF SYSTEMS    Review of Systems:  Gen:  Denies  fever, sweats, chills weigh  loss  HEENT: Denies blurred vision, double vision, ear pain, eye pain, hearing loss, nose bleeds, sore throat Cardiac:  No dizziness, chest pain or heaviness, chest tightness,edema Resp:   reports dyspnea chronically  Gi: Denies swallowing difficulty, stomach pain, nausea or vomiting, diarrhea, constipation, bowel incontinence Gu:  Denies bladder incontinence, burning urine Ext:   Denies Joint pain, stiffness or swelling Skin: Denies  skin rash, easy bruising or bleeding or hives Endoc:  Denies polyuria, polydipsia , polyphagia or weight change Psych:   Denies depression, insomnia or hallucinations   Other:  All other systems negative   VS: BP (!) 148/74 (BP Location: Left Arm)   Pulse 71   Temp 97.6 F (36.4 C)   Resp 19   Ht 5' 2 (1.575 m)   Wt 94.3 kg   SpO2 98%   BMI 38.04 kg/m      PHYSICAL EXAM    GENERAL:NAD, no fevers, chills, no weakness no fatigue HEAD: Normocephalic, atraumatic.  EYES: Pupils equal, round, reactive to light. Extraocular muscles intact. No scleral icterus.  MOUTH: Moist mucosal membrane. Dentition intact. No abscess noted.  EAR, NOSE, THROAT: Clear without exudates. No external lesions.  NECK: Supple. No thyromegaly. No nodules. No JVD.  PULMONARY: decreased breath sounds with mild rhonchi worse at bases bilaterally.   CARDIOVASCULAR: S1 and S2. Regular rate and rhythm. No murmurs, rubs, or gallops. No edema. Pedal pulses 2+ bilaterally.  GASTROINTESTINAL: Soft, nontender, nondistended. No masses. Positive bowel sounds. No hepatosplenomegaly.  MUSCULOSKELETAL: No swelling, clubbing, or edema. Range of motion full in all extremities.  NEUROLOGIC: Cranial nerves II through XII are intact. No gross focal neurological deficits. Sensation intact. Reflexes intact.  SKIN: No ulceration, lesions, rashes, or cyanosis. Skin warm and dry. Turgor intact.  PSYCHIATRIC: Mood, affect within normal limits. The patient is awake, alert and oriented x 3. Insight, judgment intact.       IMAGING     ASSESSMENT/PLAN   Acute hypoxemic respiratory failure   - right and left hemidiaphragm elevation    - atelectasis    - will obtain respiratory viral panel and CRP trend   - continue therapy for community acquired pneumonia currently on rocephin  zithromax     -oxygen requirement will improve within 24 hours and should be weaned for goal Spo2 >88%   -currently req  Bilateral hemidiaphragm elevation    - due to obese body habitus   - improved from yesterday post large BM via enema  Bibasilar atelectasis   -patient should use incentive spirometry when able   - will order metaneb with albuterol  with RT   - PT and OT             Thank you for allowing me to participate in the care of this patient.   Patient/Family are satisfied with care plan and all questions have been answered.    Provider disclosure: Patient with at least one acute or chronic illness or injury that poses a threat to life or bodily function and is being managed actively during this encounter.  All of the below services have been performed independently by signing provider:  review of prior documentation from internal and or external health records.  Review of previous and current lab results.  Interview and comprehensive assessment during  patient visit today. Review of current and previous chest radiographs/CT scans. Discussion of management and test interpretation with health care team and patient/family.   This document was prepared using Conservation officer, historic buildings and may include  unintentional dictation errors.     Cosme Jacob, M.D.  Division of Pulmonary & Critical Care Medicine

## 2024-02-01 NOTE — Progress Notes (Signed)
 Physical Therapy Treatment Patient Details Name: Jessica Leonard MRN: [id number] DOB: Jun 28, 1941 Today's Date: 02/01/2024   History of Present Illness Pt is an 83 y.o. female who presented to the ER with a complaints of worsening dyspnea with associated cough and wheezing over the preceeding week. Seen by her PCP on 6/12 and diagnosed with bronchitis. Admitted for management of sepsis d/t pneumonia, acute respiratory failure with hypoxia. PMH of anxiety, depression, chronic back pain, essential hypertension, and dyslipidemia    PT Comments  Pt pleasant and able to participate with PT but overall continues to have issues with breathing.  Pt was able to ambulate 40-50 ft with walker on 5L but SpO2 dropped to mid 80s and pt endorses fatigue - along with chronic back and knee pain limiting ambulation tolerance.  Pt was on 3L on arrival with SpO2 at 87%, returned to 5L post session per discussion with RN.  Pt will benefit from further PT, continue with POC.    If plan is discharge home, recommend the following: A little help with walking and/or transfers;Assistance with cooking/housework;Assist for transportation;Help with stairs or ramp for entrance;A little help with bathing/dressing/bathroom   Can travel by private vehicle        Equipment Recommendations  None recommended by PT    Recommendations for Other Services       Precautions / Restrictions Precautions Precautions: Fall Recall of Precautions/Restrictions: Intact Restrictions Weight Bearing Restrictions Per Provider Order: No     Mobility  Bed Mobility Overal bed mobility: Modified Independent Bed Mobility: Supine to Sit     Supine to sit: Supervision     General bed mobility comments: Pt required supervision for coming to sitting EOB from supine, but did not need bedrails or physical assistance    Transfers Overall transfer level: Needs assistance Equipment used: Rolling walker (2 wheels) Transfers: Bed to  chair/wheelchair/BSC Sit to Stand: Supervision           General transfer comment: Maintains forward flexed posture (baseline) but maintains standing with walker w/o issue    Ambulation/Gait Ambulation/Gait assistance: Supervision Gait Distance (Feet): 45 Feet Assistive device: Rolling walker (2 wheels)         General Gait Details: slow cadence and flexed trunk over RW (leaning forearms per her baseline after minimal distance), but remained steady/no LOBs   Stairs             Wheelchair Mobility     Tilt Bed    Modified Rankin (Stroke Patients Only)       Balance Overall balance assessment: Needs assistance   Sitting balance-Leahy Scale: Normal     Standing balance support: Bilateral upper extremity supported, During functional activity Standing balance-Leahy Scale: Good Standing balance comment: Pt flexed trunk over RW during stance and ambulation, but remained steady throughout                            Communication Communication Communication: No apparent difficulties  Cognition Arousal: Alert Behavior During Therapy: WFL for tasks assessed/performed   PT - Cognitive impairments: No apparent impairments                         Following commands: Intact      Cueing Cueing Techniques: Verbal cues, Gestural cues  Exercises      General Comments General comments (skin integrity, edema, etc.): On arrival pt on 3L (weaned from 5L 1-2 hrs prior)  with SpO2 in the 87-89% range.  Bumped to 5L for ambulation and though she did not have excessive SOB she did drop down to 84%.  Returned to low 90s on 5L at rest.  Nursing aware and involved with flow rate      Pertinent Vitals/Pain Pain Assessment Pain Assessment: Faces Faces Pain Scale: Hurts a little bit Pain Location: chronic lower back and knee pain Pain Descriptors / Indicators: Aching, Sore    Home Living                          Prior Function             PT Goals (current goals can now be found in the care plan section) Progress towards PT goals: Progressing toward goals    Frequency    Min 2X/week      PT Plan      Co-evaluation              AM-PAC PT 6 Clicks Mobility   Outcome Measure  Help needed turning from your back to your side while in a flat bed without using bedrails?: None Help needed moving from lying on your back to sitting on the side of a flat bed without using bedrails?: A Little Help needed moving to and from a bed to a chair (including a wheelchair)?: A Little Help needed standing up from a chair using your arms (e.g., wheelchair or bedside chair)?: A Little Help needed to walk in hospital room?: A Little Help needed climbing 3-5 steps with a railing? : A Little 6 Click Score: 19    End of Session Equipment Utilized During Treatment: Gait belt;Oxygen Activity Tolerance: Patient tolerated treatment well Patient left: in chair;with call bell/phone within reach;with chair alarm set;with family/visitor present Nurse Communication: Mobility status PT Visit Diagnosis: Unsteadiness on feet (R26.81);Other abnormalities of gait and mobility (R26.89);Repeated falls (R29.6)     Time: 8863-8798 PT Time Calculation (min) (ACUTE ONLY): 25 min  Charges:    $Gait Training: 8-22 mins $Therapeutic Exercise: 8-22 mins PT General Charges $$ ACUTE PT VISIT: 1 Visit                     Carmin JONELLE Deed, DPT 02/01/2024, 12:58 PM

## 2024-02-01 NOTE — Progress Notes (Signed)
 Overnight Pulse Oximetry study initiated at this time. Patient is currently on 3.5L [place] with a SAT of 89%, therefore, overnight pulse ox study will not be conducted on room air for patient safety.

## 2024-02-02 DIAGNOSIS — E66812 Obesity, class 2: Secondary | ICD-10-CM

## 2024-02-02 DIAGNOSIS — J9601 Acute respiratory failure with hypoxia: Secondary | ICD-10-CM | POA: Diagnosis not present

## 2024-02-02 DIAGNOSIS — A419 Sepsis, unspecified organism: Secondary | ICD-10-CM | POA: Diagnosis not present

## 2024-02-02 DIAGNOSIS — J189 Pneumonia, unspecified organism: Secondary | ICD-10-CM | POA: Diagnosis not present

## 2024-02-02 LAB — C-REACTIVE PROTEIN: CRP: 0.5 mg/dL (ref <1.0)

## 2024-02-02 NOTE — Progress Notes (Signed)
 Physical Therapy Treatment Patient Details Name: Jessica Leonard MRN: [id number] DOB: 1941/01/29 Today's Date: 02/02/2024   History of Present Illness Pt is an 83 y.o. female who presented to the ER with a complaints of worsening dyspnea with associated cough and wheezing over the preceeding week. Seen by her PCP on 6/12 and diagnosed with bronchitis. Admitted for management of sepsis d/t pneumonia, acute respiratory failure with hypoxia. PMH of anxiety, depression, chronic back pain, essential hypertension, and dyslipidemia    PT Comments  Pt remains pleasant and motivated, though she is disappointed that she will need O2 she seems resigned to the idea.  She was able to ambulate 150ft, then 75 ft with walker (leaning on elbows, per baseline, to end each effort), on 4L with SpO2 staying in the 87-90% range nearly the entire time.  Pt endorses some fatigue with overt DOE/SOB, chronic back/knee/shoulder pain also a limiter with prolonged ambulation.  Pt overall did well, showed ability to manage in the home with O2.  Continue with POC.    If plan is discharge home, recommend the following: A little help with walking and/or transfers;Assistance with cooking/housework;Assist for transportation;Help with stairs or ramp for entrance;A little help with bathing/dressing/bathroom   Can travel by private vehicle        Equipment Recommendations  None recommended by PT    Recommendations for Other Services       Precautions / Restrictions Precautions Precautions: Fall Recall of Precautions/Restrictions: Intact Restrictions Weight Bearing Restrictions Per Provider Order: No     Mobility  Bed Mobility Overal bed mobility: Modified Independent Bed Mobility: Supine to Sit, Sit to Supine     Supine to sit: Supervision Sit to supine: Contact guard assist        Transfers Overall transfer level: Needs assistance Equipment used: Rolling walker (2 wheels) Transfers: Sit to/from Stand Sit  to Stand: Supervision           General transfer comment: from bed and recliner, did not need direct assist however heavily reliant on forward lean and b/l UE use    Ambulation/Gait Ambulation/Gait assistance: Supervision Gait Distance (Feet): 125 Feet Assistive device: Rolling walker (2 wheels)         General Gait Details: Pt was able to ambulate further than any prior effort (more than I ever walk at home), though when her arms/back start to hurt she did rest elbows on walker (per her baseline) to continue.  Pt on 4L O2 during the effort, SpO2 checked many times t/o the effort and seems to stay in the 88% range nearly the entire time.  After seated rest break pt was able to do another bout of ambulation ~75 ft before needing to sit/rest again.  Pt endorses some fatigue but with no overt DOE/SOB.   Stairs             Wheelchair Mobility     Tilt Bed    Modified Rankin (Stroke Patients Only)       Balance Overall balance assessment: Needs assistance Sitting-balance support: No upper extremity supported, Feet supported Sitting balance-Leahy Scale: Normal Sitting balance - Comments: Steady reaching within BOS   Standing balance support: Bilateral upper extremity supported, During functional activity Standing balance-Leahy Scale: Good Standing balance comment: Pt flexed trunk over RW during stance and ambulation, but remained steady throughout.                            Communication  Communication Communication: No apparent difficulties  Cognition Arousal: Alert Behavior During Therapy: WFL for tasks assessed/performed   PT - Cognitive impairments: No apparent impairments                         Following commands: Intact      Cueing Cueing Techniques: Verbal cues, Gestural cues  Exercises      General Comments General comments (skin integrity, edema, etc.): VSS.      Pertinent Vitals/Pain Pain Assessment Faces Pain Scale:  Hurts a little bit Pain Location: chronic lower back and knee pain    Home Living                          Prior Function            PT Goals (current goals can now be found in the care plan section) Progress towards PT goals: Progressing toward goals    Frequency    Min 2X/week      PT Plan      Co-evaluation              AM-PAC PT 6 Clicks Mobility   Outcome Measure  Help needed turning from your back to your side while in a flat bed without using bedrails?: None Help needed moving from lying on your back to sitting on the side of a flat bed without using bedrails?: A Little Help needed moving to and from a bed to a chair (including a wheelchair)?: A Little Help needed standing up from a chair using your arms (e.g., wheelchair or bedside chair)?: A Little Help needed to walk in hospital room?: A Little Help needed climbing 3-5 steps with a railing? : A Little 6 Click Score: 19    End of Session Equipment Utilized During Treatment: Gait belt;Oxygen Activity Tolerance: Patient tolerated treatment well Patient left: in chair;with call bell/phone within reach;with chair alarm set;with family/visitor present Nurse Communication: Mobility status PT Visit Diagnosis: Unsteadiness on feet (R26.81);Other abnormalities of gait and mobility (R26.89);Repeated falls (R29.6)     Time: 1430-1456 PT Time Calculation (min) (ACUTE ONLY): 26 min  Charges:    $Gait Training: 23-37 mins PT General Charges $$ ACUTE PT VISIT: 1 Visit                     Carmin JONELLE Deed, DPT 02/02/2024, 3:32 PM

## 2024-02-02 NOTE — Progress Notes (Signed)
  Progress Note   Patient: Jessica Leonard FMW:[id number] DOB: 1941/07/03 DOA: 01/24/2024     8 DOS: the patient was seen and examined on 02/02/2024   Brief hospital course: Jessica Leonard is a 83 y.o. female with medical history significant for anxiety, depression, chronic back pain, essential hypertension, and dyslipidemia who presented to the emergency room, presented to the emergency room with a calcium  of worsening dyspnea with associated cough and wheezing over the last week.  Patient subsequently admitted for acute hypoxemic respiratory failure due to community-acquired pneumonia.    Principal Problem:   Sepsis due to pneumonia [facility]) Active Problems:   Acute respiratory failure with hypoxia (HCC)   Essential hypertension   Dyslipidemia   Anxiety and depression   GERD without esophagitis   Acute hypoxic respiratory failure (HCC)   Class 2 obesity   Assessment and Plan: Sepsis due to pneumonia (HCC) Acute respite failure with hypoxemia secondary to pneumonia. Obstructive sleep apnea - Sepsis is manifested by tachycardia and tachypnea. Has completed 5 days course of ceftriaxone  and azithromycin .   Based on her chest CT scan, she has a minimal pneumonia, procalcitonin level less than 0.1.  Hypoxemia seem to be disproportional to the amount of pneumonia.  She does not have any history of COPD, non-smoker.  BNP was normal.  Patient is also placed on steroids taper for acute bronchitis. I suspect the patient had a viral pneumonia on top of obstructive sleep apnea. Overnight oximetry did show hypoxia on 3% of time while oxygen last night.  She will need home oxygen.  She will also need a formal sleep study as outpatient. Patient currently still requires 3 L oxygen, will continue with DuoNeb, steroids, continuing oxygen, may be able to discharge tomorrow.    Class II obesity with BMI 38.02. Diet and exercise advised.   GERD without esophagitis Continue PPI therapy.   Anxiety  and depression - Will continue Cymbalta  and Tofranil    Dyslipidemia - Will continue statin therapy.   Essential hypertension - Continue antihypertensive therapy.        Subjective:  Still complain short of breath with exertion, no cough.  On 3 L oxygen.  Physical Exam: Vitals:   02/01/24 2024 02/01/24 2046 02/02/24 0445 02/02/24 0750  BP:   (!) 153/85 (!) 166/88  Pulse: 84  73 68  Resp:   18 18  Temp:   98 F (36.7 C) 98 F (36.7 C)  TempSrc:      SpO2: 90% 90% 93% 91%  Weight:      Height:       General exam: Appears calm and comfortable  Respiratory system: Clear to auscultation. Respiratory effort normal. Cardiovascular system: S1 & S2 heard, RRR. No JVD, murmurs, rubs, gallops or clicks. No pedal edema. Gastrointestinal system: Abdomen is nondistended, soft and nontender. No organomegaly or masses felt. Normal bowel sounds heard. Central nervous system: Alert and oriented. No focal neurological deficits. Extremities: Symmetric 5 x 5 power. Skin: No rashes, lesions or ulcers Psychiatry: Judgement and insight appear normal. Mood & affect appropriate.    Data Reviewed:  Lab results reviewed.  Family Communication: Daughter updated at bedside.  Disposition: Status is: Inpatient Remains inpatient appropriate because: Severity of disease.     Time spent: 35 minutes  Author: Murvin Mana, MD 02/02/2024 12:02 PM  For on call review [email or web address].

## 2024-02-02 NOTE — Progress Notes (Signed)
 PULMONOLOGY         Date: 02/02/2024,   MRN# [id number] Jessica Leonard 07/10/1941     AdmissionWeight: 94.3 kg                 CurrentWeight: 94.3 kg  Referring provider: Dr Dorinda   CHIEF COMPLAINT:   Progressive dyspnea with acute on chronic hypoxemic respiratory failure   HISTORY OF PRESENT ILLNESS   This is an 83 yo F with hx of MDD, lumbago, dyslipidemia, HTN, came in with worsening dyspnea and wheezing over 1-2 week period of time. Patient denies febrile illness, but does admit to having myalgias, subjective chills and was found to be hypoxemic in ER with lowest spO2 at 75%.  She had outpatient failure with treatment of LRTI after completing course of doxycycline and prednisone.  She was also hypertensive and blood work revealed hypokalemia and chest imaging was performed with RLL opacification suggestive of CAP.  Despite 2d of therapy she continues to have hypoxememia and PCCM consult placed for additional evaluation and management. Ive reviewed her CT chest there appears to be elevation of right hemidiagphragm and to a lesser degree left side.    01/29/24- patient had large BM and feels better.  She remains on 15L/min, however on examination the nasal canula is not in her nose but hanging off upper lip and patient reports left nare is obstructed.  She previously was seen by ENT but does not recall what her diagnosis was.  Ive discussed this with patient and family at bedside including daughter and son in law. Further after consulting with RT we will change interface to mask and see if patient will be weaned better since she primary is mouth breathing. She did participate with metaneb. Repeat CXR with persistent atelectatic changes but right lower lobe is improved.   01/31/24- patient seen at bedside.  She is with family visiting today. She is smiling now and is reporting improved respiratory status. Her O2 req is weaned from 15L/min to 6L/min.  Bloodwork is improved.  She  remains on solumedrol 40IV daily   02/01/24- patient sitting up in chair eating meal during my visit.  She has friend visiting today.  She is now on 3L/min Andrew.  She states CPAP has been ordered for her at bedtime but she does not wish to use it.  She feels much improved. Starting tommorow we can wean steroids with prednisone 50mg  taper. She is getting close to her baseline. Will repeat CXR today  02/02/24- patient continues to improve, she may be able to come off oxygen today if RT is able to wean her off of it.  She does report improvement overall. CRP has normalized and she is on tapering dose of steroids. She may be getting clsoe to her baseline and is appropriate for dc planning phase of care from pulmonary standpoint.   PAST MEDICAL HISTORY   Past Medical History:  Diagnosis Date   Anxiety    Complication of anesthesia    hard time waking up after hysterectomy   Depression    Epigastric pain    Hypertension      SURGICAL HISTORY   Past Surgical History:  Procedure Laterality Date   ABDOMINAL HYSTERECTOMY     APPENDECTOMY     back fusion     BACK SURGERY     BUNIONECTOMY     cataracts     COLONOSCOPY WITH PROPOFOL  N/A 10/08/2020   Procedure: COLONOSCOPY WITH PROPOFOL ;  Surgeon:  Unk Corinn Skiff, MD;  Location: [facility];  Service: Gastroenterology;  Laterality: N/A;   ESOPHAGOGASTRODUODENOSCOPY (EGD) WITH PROPOFOL  N/A 01/15/2020   Procedure: ESOPHAGOGASTRODUODENOSCOPY (EGD) WITH PROPOFOL ;  Surgeon: Unk Corinn Skiff, MD;  Location: [facility];  Service: Gastroenterology;  Laterality: N/A;   ESOPHAGOGASTRODUODENOSCOPY (EGD) WITH PROPOFOL  N/A 10/08/2020   Procedure: ESOPHAGOGASTRODUODENOSCOPY (EGD) WITH PROPOFOL ;  Surgeon: Unk Corinn Skiff, MD;  Location: [facility];  Service: Gastroenterology;  Laterality: N/A;   EYE SURGERY     INCONTINENCE SURGERY     TOTAL SHOULDER ARTHROPLASTY Bilateral      FAMILY HISTORY   Family History  Problem Relation Age of  Onset   Diabetes Mellitus II Father      SOCIAL HISTORY   Social History   Tobacco Use   Smoking status: Former    Current packs/day: 0.00    Types: Cigarettes    Quit date: 2011    Years since quitting: 14.4   Smokeless tobacco: Never  Vaping Use   Vaping status: Never Used  Substance Use Topics   Alcohol use: Not Currently   Drug use: Never     MEDICATIONS    Home Medication:    Current Medication:  Current Facility-Administered Medications:    acetaminophen  (TYLENOL ) tablet 650 mg, 650 mg, Oral, Q6H PRN, 650 mg at 02/01/24 2225 **OR** acetaminophen  (TYLENOL ) suppository 650 mg, 650 mg, Rectal, Q6H PRN, Mansy, Jan A, MD   artificial tears ophthalmic solution 1-2 drop, 1-2 drop, Both Eyes, PRN, Djan, Drue DASEN, MD   atorvastatin  (LIPITOR) tablet 20 mg, 20 mg, Oral, QHS, Djan, Prince T, MD, 20 mg at 02/01/24 2220   calcium -vitamin D  (OSCAL WITH D) 500-5 MG-MCG per tablet 1 tablet, 1 tablet, Oral, BID WC, Djan, Drue DASEN, MD, 1 tablet at 02/01/24 1651   cholecalciferol  (VITAMIN D3) 25 MCG (1000 UNIT) tablet 1,000 Units, 1,000 Units, Oral, Daily, Dorinda Drue DASEN, MD, 1,000 Units at 02/01/24 0949   cyanocobalamin  (VITAMIN B12) tablet 500 mcg, 500 mcg, Oral, Daily, Djan, Prince T, MD, 500 mcg at 02/01/24 0951   DULoxetine  (CYMBALTA ) DR capsule 60 mg, 60 mg, Oral, Daily, Djan, Prince T, MD, 60 mg at 02/01/24 0949   enoxaparin  (LOVENOX ) injection 50 mg, 50 mg, Subcutaneous, Q24H, Meegan, Eryn, RPH, 50 mg at 02/01/24 0949   fluticasone  (FLONASE ) 50 MCG/ACT nasal spray 1 spray, 1 spray, Each Nare, Daily, Djan, Drue DASEN, MD, 1 spray at 02/01/24 9051   guaiFENesin  (MUCINEX ) 12 hr tablet 600 mg, 600 mg, Oral, BID, Mansy, Jan A, MD, 600 mg at 02/01/24 2220   ipratropium-albuterol  (DUONEB) 0.5-2.5 (3) MG/3ML nebulizer solution 3 mL, 3 mL, Nebulization, TID, Hadrian Yarbrough, MD, 3 mL at 02/01/24 2043   lactulose  (CHRONULAC ) 10 GM/15ML solution 20 g, 20 g, Oral, Daily, Decarlo Rivet, MD,  20 g at 02/01/24 0951   magnesium hydroxide (MILK OF MAGNESIA) suspension 30 mL, 30 mL, Oral, Daily PRN, Mansy, Jan A, MD   multivitamin (PROSIGHT) tablet 1 tablet, 1 tablet, Oral, BID, Niels Kayla FALCON, RPH, 1 tablet at 02/01/24 2221   NIFEdipine  (ADALAT  CC) 24 hr tablet 60 mg, 60 mg, Oral, Daily, Djan, Prince T, MD, 60 mg at 02/01/24 0950   ondansetron  (ZOFRAN ) tablet 4 mg, 4 mg, Oral, Q6H PRN **OR** ondansetron  (ZOFRAN ) injection 4 mg, 4 mg, Intravenous, Q6H PRN, Mansy, Jan A, MD   pantoprazole  (PROTONIX ) EC tablet 40 mg, 40 mg, Oral, Daily, Jesus America, NP, 40 mg at 02/01/24 0949   predniSONE (DELTASONE) tablet 50 mg,  50 mg, Oral, Q breakfast **FOLLOWED BY** [START ON 02/03/2024] predniSONE (DELTASONE) tablet 45 mg, 45 mg, Oral, Q breakfast **FOLLOWED BY** [START ON 02/04/2024] predniSONE (DELTASONE) tablet 40 mg, 40 mg, Oral, Q breakfast **FOLLOWED BY** [START ON 02/05/2024] predniSONE (DELTASONE) tablet 35 mg, 35 mg, Oral, Q breakfast **FOLLOWED BY** [START ON 02/06/2024] predniSONE (DELTASONE) tablet 30 mg, 30 mg, Oral, Q breakfast **FOLLOWED BY** [START ON 02/07/2024] predniSONE (DELTASONE) tablet 25 mg, 25 mg, Oral, Q breakfast **FOLLOWED BY** [START ON 02/08/2024] predniSONE (DELTASONE) tablet 20 mg, 20 mg, Oral, Q breakfast **FOLLOWED BY** [START ON 02/09/2024] predniSONE (DELTASONE) tablet 15 mg, 15 mg, Oral, Q breakfast **FOLLOWED BY** [START ON 02/10/2024] predniSONE (DELTASONE) tablet 10 mg, 10 mg, Oral, Q breakfast **FOLLOWED BY** [START ON 02/11/2024] predniSONE (DELTASONE) tablet 5 mg, 5 mg, Oral, Q breakfast, Meegan, Eryn, RPH   traZODone (DESYREL) tablet 25 mg, 25 mg, Oral, QHS PRN, Mansy, Jan A, MD    ALLERGIES   Sulfa antibiotics, Amoxicillin, Codeine, Clindamycin, Nsaids, and Other     REVIEW OF SYSTEMS    Review of Systems:  Gen:  Denies  fever, sweats, chills weigh loss  HEENT: Denies blurred vision, double vision, ear pain, eye pain, hearing loss, nose bleeds, sore  throat Cardiac:  No dizziness, chest pain or heaviness, chest tightness,edema Resp:   reports dyspnea chronically  Gi: Denies swallowing difficulty, stomach pain, nausea or vomiting, diarrhea, constipation, bowel incontinence Gu:  Denies bladder incontinence, burning urine Ext:   Denies Joint pain, stiffness or swelling Skin: Denies  skin rash, easy bruising or bleeding or hives Endoc:  Denies polyuria, polydipsia , polyphagia or weight change Psych:   Denies depression, insomnia or hallucinations   Other:  All other systems negative   VS: BP (!) 166/88 (BP Location: Left Arm)   Pulse 68   Temp 98 F (36.7 C)   Resp 18   Ht 5' 2 (1.575 m)   Wt 94.3 kg   SpO2 91%   BMI 38.02 kg/m      PHYSICAL EXAM    GENERAL:NAD, no fevers, chills, no weakness no fatigue HEAD: Normocephalic, atraumatic.  EYES: Pupils equal, round, reactive to light. Extraocular muscles intact. No scleral icterus.  MOUTH: Moist mucosal membrane. Dentition intact. No abscess noted.  EAR, NOSE, THROAT: Clear without exudates. No external lesions.  NECK: Supple. No thyromegaly. No nodules. No JVD.  PULMONARY: decreased breath sounds with mild rhonchi worse at bases bilaterally.  CARDIOVASCULAR: S1 and S2. Regular rate and rhythm. No murmurs, rubs, or gallops. No edema. Pedal pulses 2+ bilaterally.  GASTROINTESTINAL: Soft, nontender, nondistended. No masses. Positive bowel sounds. No hepatosplenomegaly.  MUSCULOSKELETAL: No swelling, clubbing, or edema. Range of motion full in all extremities.  NEUROLOGIC: Cranial nerves II through XII are intact. No gross focal neurological deficits. Sensation intact. Reflexes intact.  SKIN: No ulceration, lesions, rashes, or cyanosis. Skin warm and dry. Turgor intact.  PSYCHIATRIC: Mood, affect within normal limits. The patient is awake, alert and oriented x 3. Insight, judgment intact.       IMAGING     ASSESSMENT/PLAN   Acute hypoxemic respiratory failure   -  right and left hemidiaphragm elevation    - atelectasis    - s/p respiratory viral panel and CRP trend   - continue therapy for community acquired pneumonia currently on rocephin  zithromax     -oxygen requirement will improve within 24 hours and should be weaned for goal Spo2 >88%   -currently req  Bilateral hemidiaphragm elevation    -  due to obese body habitus   - improved   Bibasilar atelectasis   -patient should use incentive spirometry when able   - will order metaneb with albuterol  with RT   - PT and OT             Thank you for allowing me to participate in the care of this patient.   Patient/Family are satisfied with care plan and all questions have been answered.    Provider disclosure: Patient with at least one acute or chronic illness or injury that poses a threat to life or bodily function and is being managed actively during this encounter.  All of the below services have been performed independently by signing provider:  review of prior documentation from internal and or external health records.  Review of previous and current lab results.  Interview and comprehensive assessment during patient visit today. Review of current and previous chest radiographs/CT scans. Discussion of management and test interpretation with health care team and patient/family.   This document was prepared using Dragon voice recognition software and may include unintentional dictation errors.     Takeo Harts, M.D.  Division of Pulmonary & Critical Care Medicine

## 2024-02-02 NOTE — Plan of Care (Signed)
 Patient remains free from any noted signs of acute distress.  Noted to have tolerated sleep study procedure throughout current shift.  No additional medical interventions required at this time.

## 2024-02-02 NOTE — Progress Notes (Addendum)
 Occupational Therapy Treatment Patient Details Name: Jessica Leonard MRN: [id number] DOB: February 10, 1941 Today's Date: 02/02/2024   History of present illness Pt is an 83 y.o. female who presented to the ER with a complaints of worsening dyspnea with associated cough and wheezing over the preceeding week. Seen by her PCP on 6/12 and diagnosed with bronchitis. Admitted for management of sepsis d/t pneumonia, acute respiratory failure with hypoxia. PMH of anxiety, depression, chronic back pain, essential hypertension, and dyslipidemia   OT comments  Pt seen for OT treatment on this date. Upon arrival to room pt semi supine with daughter and caregiver at bedside, agreeable to tx. Pt MODI when performing bed mobility and LB dressing while sitting on the EOB at the start of session. Pt amb with RW into BR to progress STS transfers on and off the toilet with CGA initially- progressed to supervision. Pt completed x5 STS reps with min verbal cues for hand placement and RW management with CGA for safety. Pt on 3.5L/min throughout session, Sp02 <90% pre/post mobility. Pt educated on fall prevention techniques and home modifications to ensure less falls and safety when amb with DME. Pt retired in [employer] with feet elevated and all other needs within reach. Pt making good progress toward goals, will continue to follow POC. Discharge recommendation remains appropriate.        If plan is discharge home, recommend the following:  A little help with walking and/or transfers;A little help with bathing/dressing/bathroom   Equipment Recommendations  Other (comment)    Recommendations for Other Services      Precautions / Restrictions Precautions Precautions: Fall Recall of Precautions/Restrictions: Intact Precaution/Restrictions Comments: Monitor SPO2% Restrictions Weight Bearing Restrictions Per Provider Order: No       Mobility Bed Mobility Overal bed mobility: Modified Independent              General bed mobility comments: No physcial assistance to come to the EOB. HOB elevated    Transfers Overall transfer level: Needs assistance Equipment used: Rolling walker (2 wheels) Transfers: Sit to/from Stand Sit to Stand: Supervision           General transfer comment: Steady RW use throughout amb within room/BR. Verbal cues/demonstration of DME management to maintain RW close range during ADL completion.     Balance Overall balance assessment: Needs assistance Sitting-balance support: No upper extremity supported, Feet supported Sitting balance-Leahy Scale: Normal Sitting balance - Comments: Steady reaching within BOS   Standing balance support: Bilateral upper extremity supported, During functional activity Standing balance-Leahy Scale: Good Standing balance comment: Pt flexed trunk over RW during stance and ambulation, but remained steady throughout.                           ADL either performed or assessed with clinical judgement   ADL Overall ADL's : Needs assistance/impaired     Grooming: Wash/dry hands;Standing;Contact guard assist Grooming Details (indicate cue type and reason): Sink level             Lower Body Dressing: Modified independent;Sitting/lateral leans Lower Body Dressing Details (indicate cue type and reason): Donning/doffing socks Toilet Transfer: Ambulation;Rolling walker (2 wheels);Contact guard assist;Cueing for safety           Functional mobility during ADLs: Contact guard assist;Supervision/safety;Rolling walker (2 wheels) General ADL Comments: Pt reports she completed all ADLs prior to session. Completed toilet transfers and STS reps for good carryover once returing home.    Extremity/Trunk Assessment  Vision       Perception     Praxis     Communication Communication Communication: No apparent difficulties   Cognition Arousal: Alert Behavior During Therapy: WFL for tasks  assessed/performed Cognition: No apparent impairments                               Following commands: Intact        Cueing   Cueing Techniques: Verbal cues, Gestural cues  Exercises Exercises: Other exercises Other Exercises Other Exercises: Edu: Role of OT tx, DME management during ADL/transfer completion    Shoulder Instructions       General Comments VSS.    Pertinent Vitals/ Pain       Pain Assessment Pain Assessment: No/denies pain  Home Living                                          Prior Functioning/Environment              Frequency  Min 2X/week        Progress Toward Goals  OT Goals(current goals can now be found in the care plan section)  Progress towards OT goals: Progressing toward goals  Acute Rehab OT Goals OT Goal Formulation: With patient/family Time For Goal Achievement: 02/09/24 Potential to Achieve Goals: Good ADL Goals Pt Will Perform Lower Body Bathing: with supervision;sitting/lateral leans;sit to/from stand Pt Will Perform Lower Body Dressing: with supervision;sitting/lateral leans;sit to/from stand Pt Will Transfer to Toilet: with supervision;ambulating;regular height toilet Additional ADL Goal #1: Pt will demo implementation of 1 learned ECS during ADL performance and transfers 2/2 trials to promote IND/safety and prevent overexertion.  Plan      Co-evaluation                 AM-PAC OT 6 Clicks Daily Activity     Outcome Measure   Help from another person eating meals?: None Help from another person taking care of personal grooming?: None Help from another person toileting, which includes using toliet, bedpan, or urinal?: A Little Help from another person bathing (including washing, rinsing, drying)?: A Little Help from another person to put on and taking off regular upper body clothing?: None Help from another person to put on and taking off regular lower body clothing?: A  Little 6 Click Score: 21    End of Session Equipment Utilized During Treatment: Oxygen;Gait belt;Rolling walker (2 wheels)  OT Visit Diagnosis: Other abnormalities of gait and mobility (R26.89);Muscle weakness (generalized) (M62.81)   Activity Tolerance Patient tolerated treatment well   Patient Left in chair;with call bell/phone within reach;with chair alarm set;with family/visitor present   Nurse Communication Mobility status        Time: 8870-8846 OT Time Calculation (min): 24 min  Charges: OT General Charges $OT Visit: 1 Visit OT Treatments $Self Care/Home Management : 8-22 mins $Therapeutic Activity: 8-22 mins  Larraine Colas M.S. OTR/L  02/02/24, 12:29 PM

## 2024-02-02 NOTE — Plan of Care (Signed)
  Problem: Fluid Volume: Goal: Hemodynamic stability will improve Outcome: Progressing   Problem: Clinical Measurements: Goal: Diagnostic test results will improve Outcome: Progressing Goal: Signs and symptoms of infection will decrease Outcome: Progressing   Problem: Education: Goal: Knowledge of General Education information will improve Description: Including pain rating scale, medication(s)/side effects and non-pharmacologic comfort measures Outcome: Progressing   Problem: Health Behavior/Discharge Planning: Goal: Ability to manage health-related needs will improve Outcome: Progressing   Problem: Clinical Measurements: Goal: Will remain free from infection Outcome: Progressing Goal: Diagnostic test results will improve Outcome: Progressing   Problem: Activity: Goal: Risk for activity intolerance will decrease Outcome: Progressing   Problem: Nutrition: Goal: Adequate nutrition will be maintained Outcome: Progressing   Problem: Elimination: Goal: Will not experience complications related to bowel motility Outcome: Progressing Goal: Will not experience complications related to urinary retention Outcome: Progressing

## 2024-02-03 ENCOUNTER — Other Ambulatory Visit: Payer: Self-pay

## 2024-02-03 DIAGNOSIS — J189 Pneumonia, unspecified organism: Secondary | ICD-10-CM | POA: Diagnosis not present

## 2024-02-03 DIAGNOSIS — A419 Sepsis, unspecified organism: Secondary | ICD-10-CM | POA: Diagnosis not present

## 2024-02-03 DIAGNOSIS — J9601 Acute respiratory failure with hypoxia: Secondary | ICD-10-CM | POA: Diagnosis not present

## 2024-02-03 DIAGNOSIS — E66812 Obesity, class 2: Secondary | ICD-10-CM | POA: Diagnosis not present

## 2024-02-03 MED ORDER — FLUTICASONE PROPIONATE 50 MCG/ACT NA SUSP
1.0000 | Freq: Every day | NASAL | 0 refills | Status: AC
  Filled 2024-02-03: qty 16, 60d supply, fill #0

## 2024-02-03 MED ORDER — PREDNISONE 10 MG PO TABS
ORAL_TABLET | ORAL | 0 refills | Status: AC
  Filled 2024-02-03: qty 20, 8d supply, fill #0

## 2024-02-03 MED ORDER — GUAIFENESIN ER 600 MG PO TB12
600.0000 mg | ORAL_TABLET | Freq: Two times a day (BID) | ORAL | 0 refills | Status: AC
  Filled 2024-02-03: qty 20, 10d supply, fill #0

## 2024-02-03 MED ORDER — IPRATROPIUM-ALBUTEROL 0.5-2.5 (3) MG/3ML IN SOLN
3.0000 mL | Freq: Three times a day (TID) | RESPIRATORY_TRACT | 0 refills | Status: AC
  Filled 2024-02-03: qty 270, 30d supply, fill #0

## 2024-02-03 NOTE — Plan of Care (Signed)

## 2024-02-03 NOTE — Discharge Summary (Signed)
 Physician Discharge Summary   Patient: Jessica Leonard MRN: [id number] DOB: [birth year]  Admit date:     01/24/2024  Discharge date: 02/03/24  Discharge Physician: Murvin Mana   PCP: Sherial Bail, MD   Recommendations at discharge:   Follow-up with PCP in 1 week. Follow-up with pulmonology in 2 weeks. Perform sleep study as outpatient  Discharge Diagnoses: Principal Problem:   Sepsis due to pneumonia [facility]) Active Problems:   Acute respiratory failure with hypoxia (HCC)   Essential hypertension   Dyslipidemia   Anxiety and depression   GERD without esophagitis   Acute hypoxic respiratory failure (HCC)   Class 2 obesity  Resolved Problems:   * No resolved hospital problems. *  Hospital Course: Jessica Leonard is a 83 y.o. female with medical history significant for anxiety, depression, chronic back pain, essential hypertension, and dyslipidemia who presented to the emergency room, presented to the emergency room with a calcium  of worsening dyspnea with associated cough and wheezing over the last week.  Patient subsequently admitted for acute hypoxemic respiratory failure due to community-acquired pneumonia.  Patient was treated with antibiotics, she was also treated with steroids and DuoNeb for acute bronchitis.  Condition had improved, medically stable for discharge.  Assessment and Plan: Sepsis due to pneumonia (HCC) Acute respite failure with hypoxemia secondary to pneumonia. Obstructive sleep apnea - Sepsis is manifested by tachycardia and tachypnea. Has completed 5 days course of ceftriaxone  and azithromycin .   Based on her chest CT scan, she has a minimal pneumonia, procalcitonin level less than 0.1.  Hypoxemia seem to be disproportional to the amount of pneumonia.  She does not have any history of COPD, non-smoker.  BNP was normal.  Patient is also placed on steroids taper for acute bronchitis. I suspect the patient had a viral pneumonia on top of obstructive  sleep apnea. Overnight oximetry did show hypoxia on 3% of time while oxygen last night.  She will need home oxygen.  She will also need a formal sleep study as outpatient. Patient currently still requires 3 L oxygen, will continue with DuoNeb, steroids, continuing oxygen. Prescribe oxygen for home use.  Medically stable for discharge.  Continue steroid taper at discharge.     Class II obesity with BMI 38.02. Diet and exercise advised.   GERD without esophagitis Continue PPI therapy.   Anxiety and depression - Will continue Cymbalta  and Tofranil    Dyslipidemia - Will continue statin therapy.   Essential hypertension - Continue antihypertensive therapy.       Consultants: Pulmonology Procedures performed: None  Disposition: Home health Diet recommendation:  Discharge Diet Orders (From admission, onward)     Start     Ordered   02/03/24 0000  Diet - low sodium heart healthy        02/03/24 1338           Cardiac diet DISCHARGE MEDICATION: Allergies as of 02/03/2024       Reactions   Sulfa Antibiotics Hives, Rash   BLOOD-RED HIVES BLOOD-RED HIVES   Amoxicillin Nausea And Vomiting   Codeine Nausea Only, Nausea And Vomiting   Clindamycin    Nsaids Other (See Comments)   Long term use of ibuprofen. Doctor wanted her not to take IBP anymore.    Other Rash   Paper tape only Paper tape only        Medication List     STOP taking these medications    doxycycline 100 MG capsule Commonly known as: VIBRAMYCIN   imipramine   25 MG tablet Commonly known as: TOFRANIL    ipratropium 0.02 % nebulizer solution Commonly known as: ATROVENT       TAKE these medications    acetaminophen  500 MG tablet Commonly known as: TYLENOL  Take 500 mg by mouth 2 (two) times daily.   atorvastatin  20 MG tablet Commonly known as: LIPITOR Take 20 mg by mouth at bedtime.   Calcium  Carb-Cholecalciferol  600-10 MG-MCG Tabs Take 1 tablet by mouth 2 (two) times daily with a  meal.   carboxymethylcellulose 0.5 % Soln Commonly known as: REFRESH PLUS Place 1-2 drops into both eyes as needed (for dry eyes).   Cholecalciferol  25 MCG (1000 UT) tablet Take 1,000 Units by mouth daily.   cyanocobalamin  500 MCG tablet Commonly known as: VITAMIN B12 Take 500 mcg by mouth daily.   DULoxetine  60 MG capsule Commonly known as: CYMBALTA  Take 60 mg by mouth daily.   fluticasone  50 MCG/ACT nasal spray Commonly known as: FLONASE  Place 1 spray into both nostrils daily. Start taking on: February 04, 2024   guaiFENesin  600 MG 12 hr tablet Commonly known as: MUCINEX  Take 1 tablet (600 mg total) by mouth 2 (two) times daily.   ipratropium-albuterol  0.5-2.5 (3) MG/3ML Soln Commonly known as: DUONEB Take 3 mLs by nebulization 3 (three) times daily.   NIFEdipine  60 MG 24 hr tablet Commonly known as: PROCARDIA  XL/NIFEDICAL XL Take 60 mg by mouth daily.   pantoprazole  40 MG tablet Commonly known as: PROTONIX  Take 1 tablet (40 mg total) by mouth 2 (two) times daily.   predniSONE  10 MG tablet Commonly known as: DELTASONE  Take 4 tablets (40 mg total) by mouth as directed for 2 days, THEN 3 tablets (30 mg total) as directed for 2 days, THEN 2 tablets (20 mg total) as directed for 2 days, THEN 1 tablet (10 mg total) as directed for 2 days. 56,4,3,3,2,2,1,1. Start taking on: February 03, 2024 What changed: See the new instructions.   PreserVision AREDS 2 Caps Take 1 capsule by mouth 2 (two) times daily.   psyllium 58.6 % packet Commonly known as: METAMUCIL Take 1 packet by mouth daily.   Semaglutide -Weight Management 1.7 MG/0.75ML Soaj Inject 1.7 mg into the skin once a week. (Monday)   Trelegy Ellipta 100-62.5-25 MCG/ACT Aepb Generic drug: Fluticasone -Umeclidin-Vilant Inhale 1 puff into the lungs daily.               Durable Medical Equipment  (From admission, onward)           Start     Ordered   02/03/24 1334  For home use only DME oxygen  Once        Question Answer Comment  Length of Need 6 Months   Mode or (Route) Nasal cannula   Liters per Minute 3   Frequency Continuous (stationary and portable oxygen unit needed)   Oxygen conserving device Yes   Oxygen delivery system Gas      02/03/24 1333            Follow-up Information     Sherial Bail, MD Follow up.   Specialty: Internal Medicine Why: Hospital follow up Contact information: [address] [place] [address] [phone]         Parris Manna, MD Follow up in 2 week(s).   Specialty: Pulmonary Disease Contact information: [address] [place] [address] [phone]                Discharge Exam: Fredricka Weights   01/25/24 0101  02/01/24 1716  Weight: 94.3 kg 94.3 kg   General exam: Appears calm and comfortable  Respiratory system: Clear to auscultation. Respiratory effort normal. Cardiovascular system: S1 & S2 heard, RRR. No JVD, murmurs, rubs, gallops or clicks. No pedal edema. Gastrointestinal system: Abdomen is nondistended, soft and nontender. No organomegaly or masses felt. Normal bowel sounds heard. Central nervous system: Alert and oriented. No focal neurological deficits. Extremities: Symmetric 5 x 5 power. Skin: No rashes, lesions or ulcers Psychiatry: Judgement and insight appear normal. Mood & affect appropriate.    Condition at discharge: good  The results of significant diagnostics from this hospitalization (including imaging, microbiology, ancillary and laboratory) are listed below for reference.   Imaging Studies: DG Chest Port 1 View Result Date: 02/01/2024 CLINICAL DATA:  Pneumonia EXAM: PORTABLE CHEST 1 VIEW COMPARISON:  Portable 01/27/2024 x-ray and older. CT scan 01/25/2024. FINDINGS: Underinflation. Stable cardiopericardial silhouette. No edema, pneumothorax or significant effusion. There is some linear opacity at the left lung base once again likely scar or atelectasis. Fixation hardware  along the lumbar spine at the edge of the imaging field. Bilateral shoulder arthroplasties. IMPRESSION: No significant oval change when adjusted for technique. Electronically Signed   By: Ranell Bring M.D.   On: 02/01/2024 15:21   DG Chest Port 1 View Result Date: 01/27/2024 CLINICAL DATA:  Shortness of breath EXAM: PORTABLE CHEST 1 VIEW COMPARISON:  X-ray and CT 01/25/2024. FINDINGS: Underinflation. Linear opacity at the bases likely scar or atelectasis. The opacity seen on prior CT scan at the right lower lobe is not as well defined on this limited x-ray. No pneumothorax or edema. Stable cardiopericardial silhouette with tortuous and ectatic aorta. Fixation hardware along the spine the edge of the imaging field inferiorly. Bilateral shoulder arthroplasty. IMPRESSION: No significant interval change.  Limited x-ray. Electronically Signed   By: Ranell Bring M.D.   On: 01/27/2024 13:55   CT CHEST ABDOMEN PELVIS W CONTRAST Result Date: 01/25/2024 EXAM: CT CHEST, ABDOMEN AND PELVIS WITH CONTRAST 01/25/2024 01:53:19 AM TECHNIQUE: CT of the chest, abdomen and pelvis was performed with the administration of intravenous contrast. Multiplanar reformatted images are provided for review. Automated exposure control, iterative reconstruction, and/or weight based adjustment of the mA/kV was utilized to reduce the radiation dose to as low as reasonably achievable. COMPARISON: CTA chest and CT abdomen/pelvis dated 01/14/2020. CLINICAL HISTORY: Sepsis. Pt to ED via [facility] from home c/o vomiting and weakness. Per ems pt has been sick with bronchitis x1week and has been taking antibiotics and started steroids Sunday. Vomiting started tonight. Pt received 4mg  zofran  and 250ml NS. Hx HTN. FINDINGS: CHEST: MEDIASTINUM: Heart and pericardium are unremarkable. The central airways are clear. THORACIC LYMPH NODES: No mediastinal, hilar or axillary lymphadenopathy. LUNGS AND PLEURA: Mild posterior right lower lobe opacity, favoring mild  infection/pneumonia over atelectasis/scarring. Additional mild patchy opacities in the lingula and left lower lobe possibly atelectasis. No pleural effusion or pneumothorax. ABDOMEN AND PELVIS: LIVER: 3.3 cm central right hepatic cyst, benign. GALLBLADDER AND BILE DUCTS: Layering small gallstones, without associated inflammatory changes. No biliary ductal dilatation. SPLEEN: No acute abnormality. PANCREAS: No acute abnormality. 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 No follow-up is recommended. KIDNEYS, URETERS AND BLADDER: 14 mm simple right lower pole renal cyst, benign (Bosniak 1). No follow-up is recommended. 4 mm calculus in the left proximal collecting system/ureter. No hydronephrosis. No perinephric or periureteral stranding. Urinary bladder is unremarkable. GI AND BOWEL: Stomach demonstrates no acute  abnormality. There is no bowel obstruction. Sigmoid diverticulosis, without evidence of diverticulitis. The appendix is not discretely visualized, favored to be surgically absent. REPRODUCTIVE ORGANS: Status post hysterectomy. PERITONEUM AND RETROPERITONEUM: No ascites. No free air. VASCULATURE: Aorta is normal in caliber. ABDOMINAL AND PELVIS LYMPH NODES: No lymphadenopathy. BONES AND SOFT TISSUES: Bilateral shoulder arthroplasties. Chronic mild anterior compression fracture deformities at T12-L1. L2-S1 lumbar fixation hardware. Mild multilevel degenerative changes of the lower thoracic and lumbar spine. No focal soft tissue abnormality. IMPRESSION: 1. Mild posterior right lower lobe opacity, favoring mild infection/pneumonia over atelectasis/scarring. 2. 4 mm calculus in the left proximal collecting system/ureter. No hydronephrosis. 3. Additional ancillary findings as above. Electronically signed by: Pinkie Pebbles MD 01/25/2024 02:02 AM EDT RP Workstation: [id number]   DG Chest Port 1 View Result Date: 01/25/2024 EXAM: 1 VIEW XRAY OF THE  CHEST 01/25/2024 12:44:00 AM COMPARISON: CT chest dated 01/14/2020. CLINICAL HISTORY: [id number] Sepsis (HCC) [id number]. Questionable code sepsis- eval for abnormality. Patient states has been on antibiotics and steroids for bronchitis x 1 week. Patient on NRB. Hx of HTN and former smoker. FINDINGS: LUNGS AND PLEURA: Low lung volumes. Mild left basilar atelectasis. No focal pulmonary opacity. No pulmonary edema. No pleural effusion. No pneumothorax. HEART AND MEDIASTINUM: No acute abnormality of the cardiac and mediastinal silhouettes. BONES AND SOFT TISSUES: Bilateral shoulder arthroplasties. Lumbar spine fixation hardware, incompletely visualized. No acute osseous abnormality. IMPRESSION: 1. Low lung volumes. 2. Mild left basilar atelectasis. Electronically signed by: Pinkie Pebbles MD 01/25/2024 01:00 AM EDT RP Workstation: [id number]    Microbiology: Results for orders placed or performed during the hospital encounter of 01/24/24  Culture, blood (Routine x 2)     Status: None   Collection Time: 01/25/24 12:16 AM   Specimen: BLOOD  Result Value Ref Range Status   Specimen Description BLOOD LEFT HAND  Final   Special Requests   Final    BOTTLES DRAWN AEROBIC AND ANAEROBIC Blood Culture results may not be optimal due to an inadequate volume of blood received in culture bottles   Culture   Final    NO GROWTH 5 DAYS Performed at [facility], [address] [place], [place] [address]    Report Status 01/30/2024 FINAL  Final  Culture, blood (Routine x 2)     Status: None   Collection Time: 01/25/24 12:16 AM   Specimen: BLOOD  Result Value Ref Range Status   Specimen Description BLOOD RIGHT HAND  Final   Special Requests   Final    BOTTLES DRAWN AEROBIC AND ANAEROBIC Blood Culture results may not be optimal due to an inadequate volume of blood received in culture bottles   Culture   Final    NO GROWTH 5 DAYS Performed at [facility], [address] [place], [place]  [address]    Report Status 01/30/2024 FINAL  Final  Urine Culture     Status: None   Collection Time: 01/25/24  1:28 AM   Specimen: Urine, Clean Catch  Result Value Ref Range Status   Specimen Description   Final    URINE, CLEAN CATCH Performed at [facility], [address], [place], [place] [address]    Special Requests   Final    NONE Performed at [facility], [address], [place], [place] [address]    Culture   Final    NO GROWTH Performed at [facility], [address] [place], [place] [address]    Report Status 01/26/2024 FINAL  Final  Resp panel by RT-PCR (RSV, Flu A&B, Covid) Anterior Nasal Swab     Status: None   Collection Time: 01/25/24  2:17 AM   Specimen: Anterior Nasal Swab  Result Value Ref Range Status   SARS Coronavirus 2 by RT PCR NEGATIVE NEGATIVE Final    Comment: (NOTE) SARS-CoV-2 target nucleic acids are NOT DETECTED.  The SARS-CoV-2 RNA is generally detectable in upper respiratory specimens during the acute phase of infection. The lowest concentration of SARS-CoV-2 viral copies this assay can detect is 138 copies/mL. A negative result does not preclude SARS-Cov-2 infection and should not be used as the sole basis for treatment or other patient management decisions. A negative result may occur with  improper specimen collection/handling, submission of specimen other than nasopharyngeal swab, presence of viral mutation(s) within the areas targeted by this assay, and inadequate number of viral copies(<138 copies/mL). A negative result must be combined with clinical observations, patient history, and epidemiological information. The expected result is Negative.  Fact Sheet for Patients:  BloggerCourse.com  Fact Sheet for Healthcare Providers:  SeriousBroker.it  This test is no t yet approved or cleared by the [place]  FDA and  has been authorized for detection and/or  diagnosis of SARS-CoV-2 by FDA under an Emergency Use Authorization (EUA). This EUA will remain  in effect (meaning this test can be used) for the duration of the COVID-19 declaration under Section 564(b)(1) of the Act, 21 U.S.C.section 360bbb-3(b)(1), unless the authorization is terminated  or revoked sooner.       Influenza A by PCR NEGATIVE NEGATIVE Final   Influenza B by PCR NEGATIVE NEGATIVE Final    Comment: (NOTE) The Xpert Xpress SARS-CoV-2/FLU/RSV plus assay is intended as an aid in the diagnosis of influenza from Nasopharyngeal swab specimens and should not be used as a sole basis for treatment. Nasal washings and aspirates are unacceptable for Xpert Xpress SARS-CoV-2/FLU/RSV testing.  Fact Sheet for Patients: BloggerCourse.com  Fact Sheet for Healthcare Providers: SeriousBroker.it  This test is not yet approved or cleared by the [place]  FDA and has been authorized for detection and/or diagnosis of SARS-CoV-2 by FDA under an Emergency Use Authorization (EUA). This EUA will remain in effect (meaning this test can be used) for the duration of the COVID-19 declaration under Section 564(b)(1) of the Act, 21 U.S.C. section 360bbb-3(b)(1), unless the authorization is terminated or revoked.     Resp Syncytial Virus by PCR NEGATIVE NEGATIVE Final    Comment: (NOTE) Fact Sheet for Patients: BloggerCourse.com  Fact Sheet for Healthcare Providers: SeriousBroker.it  This test is not yet approved or cleared by the [place]  FDA and has been authorized for detection and/or diagnosis of SARS-CoV-2 by FDA under an Emergency Use Authorization (EUA). This EUA will remain in effect (meaning this test can be used) for the duration of the COVID-19 declaration under Section 564(b)(1) of the Act, 21 U.S.C. section 360bbb-3(b)(1), unless the authorization is terminated  or revoked.  Performed at [facility], [address] [place], [place] [address]   Respiratory (~20 pathogens) panel by PCR     Status: None   Collection Time: 01/28/24 10:12 AM   Specimen: Nasopharyngeal Swab; Respiratory  Result Value Ref Range Status   Adenovirus NOT DETECTED NOT DETECTED Final   Coronavirus 229E NOT DETECTED NOT DETECTED Final    Comment: (NOTE) The Coronavirus on the Respiratory Panel, DOES NOT test for the novel  Coronavirus (2019 nCoV)    Coronavirus HKU1 NOT DETECTED NOT DETECTED  Final   Coronavirus NL63 NOT DETECTED NOT DETECTED Final   Coronavirus OC43 NOT DETECTED NOT DETECTED Final   Metapneumovirus NOT DETECTED NOT DETECTED Final   Rhinovirus / Enterovirus NOT DETECTED NOT DETECTED Final   Influenza A NOT DETECTED NOT DETECTED Final   Influenza B NOT DETECTED NOT DETECTED Final   Parainfluenza Virus 1 NOT DETECTED NOT DETECTED Final   Parainfluenza Virus 2 NOT DETECTED NOT DETECTED Final   Parainfluenza Virus 3 NOT DETECTED NOT DETECTED Final   Parainfluenza Virus 4 NOT DETECTED NOT DETECTED Final   Respiratory Syncytial Virus NOT DETECTED NOT DETECTED Final   Bordetella pertussis NOT DETECTED NOT DETECTED Final   Bordetella Parapertussis NOT DETECTED NOT DETECTED Final   Chlamydophila pneumoniae NOT DETECTED NOT DETECTED Final   Mycoplasma pneumoniae NOT DETECTED NOT DETECTED Final    Comment: Performed at [facility], [address] [place], [place] [address]    Labs: CBC: Recent Labs  Lab 01/28/24 0622 01/29/24 0558 01/30/24 0434 01/31/24 0346 02/01/24 0452  WBC 8.2 9.2 8.7 8.8 8.3  NEUTROABS 5.1 6.3 6.2 5.3 5.0  HGB 14.5 14.2 13.7 13.7 13.8  HCT 45.2 42.8 41.8 42.4 42.2  MCV 97.2 96.0 95.2 95.1 95.9  PLT 178 169 187 216 217   Basic Metabolic Panel: Recent Labs  Lab 01/28/24 0622 01/29/24 0558 01/30/24 0434 01/31/24 0346 02/01/24 0452  NA 142 141 142 143 142  K 4.0 3.9 3.9 3.7 3.5  CL 104 106 107 106 106   CO2 31 28 27 28 27   GLUCOSE 96 107* 110* 97 96  BUN 12 11 10 12 17   CREATININE 0.53 0.47 0.48 0.56 0.55  CALCIUM  9.0 8.9 9.1 9.3 9.3   Liver Function Tests: No results for input(s): AST, ALT, ALKPHOS, BILITOT, PROT, ALBUMIN in the last 168 hours. CBG: No results for input(s): GLUCAP in the last 168 hours.  Discharge time spent: greater than 30 minutes.  Signed: Murvin Mana, MD [facility] 02/03/2024

## 2024-02-03 NOTE — Progress Notes (Signed)
 SATURATION QUALIFICATIONS: (This note is used to comply with regulatory documentation for home oxygen)  Patient Saturations on Room Air at Rest = 90%  Patient Saturations on Room Air while Ambulating = 89%  Patient Saturations on 3 Liters of oxygen while Ambulating = 91%  Please briefly explain why patient needs home oxygen:

## 2024-02-03 NOTE — Progress Notes (Signed)
 Mobility Specialist - Progress Note    02/03/24 1130  Mobility  Activity Ambulated with assistance in hallway  Level of Assistance Standby assist, set-up cues, supervision of patient - no hands on  Assistive Device Front wheel walker  Distance Ambulated (ft) 160 ft  Range of Motion/Exercises Active  Activity Response Tolerated well  Mobility Referral Yes  Mobility visit 1 Mobility  Mobility Specialist Start Time (ACUTE ONLY) 1114  Mobility Specialist Stop Time (ACUTE ONLY) 1130  Mobility Specialist Time Calculation (min) (ACUTE ONLY) 16 min   Nurse requested Mobility Specialist to perform oxygen saturation test with pt which includes removing pt from oxygen both at rest and while ambulating.  Below are the results from that testing.     Patient Saturations on Room Air at Rest = spO2 89%  Patient Saturations on 3 Liters of oxygen while Ambulating = sp02 92%  Patient Saturations on Room Air while Ambulating = sp02 89% .  Rested and performed pursed lip breathing for 1 minute with sp02 at 90%. At end of testing pt left in room on 3L  Liters of oxygen. Reported results to nurse.    Pt resting in bed on 3L upon entry. Pt STS and ambulates to hallway around NS SBA with RW on RA. Pt leans over walker pt given education on proper walking mechanics but, she expressed constant back pain and leaning over helps with the pain. Pt endorses no SOB or wheezing but a little winded once we got back to the recliner. Pt left in recliner with needs in reach and chair alarm activated.   Guido Rumble Mobility Specialist 02/03/24, 11:34 AM

## 2024-02-03 NOTE — Progress Notes (Signed)
 PULMONOLOGY         Date: 02/03/2024,   MRN# [id number] Jessica Leonard [birth year]     AdmissionWeight: 94.3 kg                 CurrentWeight: 94.3 kg  Referring provider: Dr Dorinda   CHIEF COMPLAINT:   Progressive dyspnea with acute on chronic hypoxemic respiratory failure   HISTORY OF PRESENT ILLNESS   This is an 83 yo F with hx of MDD, lumbago, dyslipidemia, HTN, came in with worsening dyspnea and wheezing over 1-2 week period of time. Patient denies febrile illness, but does admit to having myalgias, subjective chills and was found to be hypoxemic in ER with lowest spO2 at 75%.  She had outpatient failure with treatment of LRTI after completing course of doxycycline and prednisone .  She was also hypertensive and blood work revealed hypokalemia and chest imaging was performed with RLL opacification suggestive of CAP.  Despite 2d of therapy she continues to have hypoxememia and PCCM consult placed for additional evaluation and management. Ive reviewed her CT chest there appears to be elevation of right hemidiagphragm and to a lesser degree left side.    01/29/24- patient had large BM and feels better.  She remains on 15L/min, however on examination the nasal canula is not in her nose but hanging off upper lip and patient reports left nare is obstructed.  She previously was seen by ENT but does not recall what her diagnosis was.  Ive discussed this with patient and family at bedside including daughter and son in law. Further after consulting with RT we will change interface to mask and see if patient will be weaned better since she primary is mouth breathing. She did participate with metaneb. Repeat CXR with persistent atelectatic changes but right lower lobe is improved.   01/31/24- patient seen at bedside.  She is with family visiting today. She is smiling now and is reporting improved respiratory status. Her O2 req is weaned from 15L/min to 6L/min.  Bloodwork is improved.  She  remains on solumedrol 40IV daily   02/01/24- patient sitting up in chair eating meal during my visit.  She has friend visiting today.  She is now on 3L/min [place].  She states CPAP has been ordered for her at bedtime but she does not wish to use it.  She feels much improved. Starting tommorow we can wean steroids with prednisone  50mg  taper. She is getting close to her baseline. Will repeat CXR today  02/02/24- patient continues to improve, she may be able to come off oxygen today if RT is able to wean her off of it.  She does report improvement overall. CRP has normalized and she is on tapering dose of steroids. She may be getting clsoe to her baseline and is appropriate for dc planning phase of care from pulmonary standpoint.   02/03/24- patient is optimized for dc home.  She's sitting up in chair and is smiling.  We have outpatient follow up set up and she is on 2L/min [place].  She has O2 ordered for home.   PAST MEDICAL HISTORY   Past Medical History:  Diagnosis Date   Anxiety    Complication of anesthesia    hard time waking up after hysterectomy   Depression    Epigastric pain    Hypertension      SURGICAL HISTORY   Past Surgical History:  Procedure Laterality Date   ABDOMINAL HYSTERECTOMY     APPENDECTOMY  back fusion     BACK SURGERY     BUNIONECTOMY     cataracts     COLONOSCOPY WITH PROPOFOL  N/A 10/08/2020   Procedure: COLONOSCOPY WITH PROPOFOL ;  Surgeon: Unk Corinn Skiff, MD;  Location[facility] ENDOSCOPY;  Service: Gastroenterology;  Laterality: N/A;   ESOPHAGOGASTRODUODENOSCOPY (EGD) WITH PROPOFOL  N/A 01/15/2020   Procedure: ESOPHAGOGASTRODUODENOSCOPY (EGD) WITH PROPOFOL ;  Surgeon: Unk Corinn Skiff, MD;  Location: [facility] ENDOSCOPY;  Service: Gastroenterology;  Laterality: N/A;   ESOPHAGOGASTRODUODENOSCOPY (EGD) WITH PROPOFOL  N/A 10/08/2020   Procedure: ESOPHAGOGASTRODUODENOSCOPY (EGD) WITH PROPOFOL ;  Surgeon: Unk Corinn Skiff, MD;  Location: [facility] ENDOSCOPY;  Service:  Gastroenterology;  Laterality: N/A;   EYE SURGERY     INCONTINENCE SURGERY     TOTAL SHOULDER ARTHROPLASTY Bilateral      FAMILY HISTORY   Family History  Problem Relation Age of Onset   Diabetes Mellitus II Father      SOCIAL HISTORY   Social History   Tobacco Use   Smoking status: Former    Current packs/day: 0.00    Types: Cigarettes    Quit date: 2011    Years since quitting: 14.4   Smokeless tobacco: Never  Vaping Use   Vaping status: Never Used  Substance Use Topics   Alcohol  use: Not Currently   Drug use: Never     MEDICATIONS    Home Medication:    Current Medication:  Current Facility-Administered Medications:    acetaminophen  (TYLENOL ) tablet 650 mg, 650 mg, Oral, Q6H PRN, 650 mg at 02/02/24 2227 **OR** acetaminophen  (TYLENOL ) suppository 650 mg, 650 mg, Rectal, Q6H PRN, Mansy, Jan A, MD   artificial tears ophthalmic solution 1-2 drop, 1-2 drop, Both Eyes, PRN, Djan, Prince T, MD   atorvastatin  (LIPITOR) tablet 20 mg, 20 mg, Oral, QHS, Djan, Prince T, MD, 20 mg at 02/02/24 2222   calcium -vitamin D  (OSCAL WITH D) 500-5 MG-MCG per tablet 1 tablet, 1 tablet, Oral, BID WC, Djan, Prince T, MD, 1 tablet at 02/03/24 0835   cholecalciferol  (VITAMIN D3) 25 MCG (1000 UNIT) tablet 1,000 Units, 1,000 Units, Oral, Daily, Dorinda Drue DASEN, MD, 1,000 Units at 02/03/24 0835   cyanocobalamin  (VITAMIN B12) tablet 500 mcg, 500 mcg, Oral, Daily, Djan, Prince T, MD, 500 mcg at 02/03/24 0836   DULoxetine  (CYMBALTA ) DR capsule 60 mg, 60 mg, Oral, Daily, Djan, Prince T, MD, 60 mg at 02/03/24 9161   enoxaparin  (LOVENOX ) injection 50 mg, 50 mg, Subcutaneous, Q24H, Meegan, Eryn, RPH, 50 mg at 02/03/24 9161   fluticasone  (FLONASE ) 50 MCG/ACT nasal spray 1 spray, 1 spray, Each Nare, Daily, Djan, Prince T, MD, 1 spray at 02/03/24 9157   guaiFENesin  (MUCINEX ) 12 hr tablet 600 mg, 600 mg, Oral, BID, Mansy, Jan A, MD, 600 mg at 02/03/24 9164   ipratropium-albuterol  (DUONEB) 0.5-2.5 (3)  MG/3ML nebulizer solution 3 mL, 3 mL, Nebulization, TID, Paden Senger, MD, 3 mL at 02/02/24 2006   lactulose  (CHRONULAC ) 10 GM/15ML solution 20 g, 20 g, Oral, Daily, Kennadi Albany, MD, 20 g at 02/01/24 9048   magnesium  hydroxide (MILK OF MAGNESIA) suspension 30 mL, 30 mL, Oral, Daily PRN, Mansy, Jan A, MD   multivitamin (PROSIGHT) tablet 1 tablet, 1 tablet, Oral, BID, Niels Kayla FALCON, RPH, 1 tablet at 02/03/24 9164   NIFEdipine  (ADALAT  CC) 24 hr tablet 60 mg, 60 mg, Oral, Daily, Djan, Prince T, MD, 60 mg at 02/03/24 9161   ondansetron  (ZOFRAN ) tablet 4 mg, 4 mg, Oral, Q6H PRN **OR** ondansetron  (ZOFRAN ) injection 4 mg, 4  mg, Intravenous, Q6H PRN, Mansy, Jan A, MD   pantoprazole  (PROTONIX ) EC tablet 40 mg, 40 mg, Oral, Daily, Jesus America, NP, 40 mg at 02/03/24 0835   [COMPLETED] predniSONE  (DELTASONE ) tablet 50 mg, 50 mg, Oral, Q breakfast, 50 mg at 02/02/24 0828 **FOLLOWED BY** [COMPLETED] predniSONE  (DELTASONE ) tablet 45 mg, 45 mg, Oral, Q breakfast, 45 mg at 02/03/24 0836 **FOLLOWED BY** [START ON 02/04/2024] predniSONE  (DELTASONE ) tablet 40 mg, 40 mg, Oral, Q breakfast **FOLLOWED BY** [START ON 02/05/2024] predniSONE  (DELTASONE ) tablet 35 mg, 35 mg, Oral, Q breakfast **FOLLOWED BY** [START ON 02/06/2024] predniSONE  (DELTASONE ) tablet 30 mg, 30 mg, Oral, Q breakfast **FOLLOWED BY** [START ON 02/07/2024] predniSONE  (DELTASONE ) tablet 25 mg, 25 mg, Oral, Q breakfast **FOLLOWED BY** [START ON 02/08/2024] predniSONE  (DELTASONE ) tablet 20 mg, 20 mg, Oral, Q breakfast **FOLLOWED BY** [START ON 02/09/2024] predniSONE  (DELTASONE ) tablet 15 mg, 15 mg, Oral, Q breakfast **FOLLOWED BY** [START ON 02/10/2024] predniSONE  (DELTASONE ) tablet 10 mg, 10 mg, Oral, Q breakfast **FOLLOWED BY** [START ON 02/11/2024] predniSONE  (DELTASONE ) tablet 5 mg, 5 mg, Oral, Q breakfast, Meegan, Eryn, RPH   traZODone  (DESYREL ) tablet 25 mg, 25 mg, Oral, QHS PRN, Mansy, Jan A, MD    ALLERGIES   Sulfa antibiotics, Amoxicillin,  Codeine, Clindamycin, Nsaids, and Other     REVIEW OF SYSTEMS    Review of Systems:  Gen:  Denies  fever, sweats, chills weigh loss  HEENT: Denies blurred vision, double vision, ear pain, eye pain, hearing loss, nose bleeds, sore throat Cardiac:  No dizziness, chest pain or heaviness, chest tightness,edema Resp:   reports dyspnea chronically  Gi: Denies swallowing difficulty, stomach pain, nausea or vomiting, diarrhea, constipation, bowel incontinence Gu:  Denies bladder incontinence, burning urine Ext:   Denies Joint pain, stiffness or swelling Skin: Denies  skin rash, easy bruising or bleeding or hives Endoc:  Denies polyuria, polydipsia , polyphagia or weight change Psych:   Denies depression, insomnia or hallucinations   Other:  All other systems negative   VS: BP (!) 158/86 (BP Location: Left Wrist)   Pulse 65   Temp 98.1 F (36.7 C)   Resp 16   Ht 5' 2 (1.575 m)   Wt 94.3 kg   SpO2 90%   BMI 38.02 kg/m      PHYSICAL EXAM    GENERAL:NAD, no fevers, chills, no weakness no fatigue HEAD: Normocephalic, atraumatic.  EYES: Pupils equal, round, reactive to light. Extraocular muscles intact. No scleral icterus.  MOUTH: Moist mucosal membrane. Dentition intact. No abscess noted.  EAR, NOSE, THROAT: Clear without exudates. No external lesions.  NECK: Supple. No thyromegaly. No nodules. No JVD.  PULMONARY: decreased breath sounds with mild rhonchi worse at bases bilaterally.  CARDIOVASCULAR: S1 and S2. Regular rate and rhythm. No murmurs, rubs, or gallops. No edema. Pedal pulses 2+ bilaterally.  GASTROINTESTINAL: Soft, nontender, nondistended. No masses. Positive bowel sounds. No hepatosplenomegaly.  MUSCULOSKELETAL: No swelling, clubbing, or edema. Range of motion full in all extremities.  NEUROLOGIC: Cranial nerves II through XII are intact. No gross focal neurological deficits. Sensation intact. Reflexes intact.  SKIN: No ulceration, lesions, rashes, or cyanosis.  Skin warm and dry. Turgor intact.  PSYCHIATRIC: Mood, affect within normal limits. The patient is awake, alert and oriented x 3. Insight, judgment intact.       IMAGING     ASSESSMENT/PLAN   Acute hypoxemic respiratory failure   - right and left hemidiaphragm elevation    - atelectasis    - s/p respiratory viral panel  and CRP trend nroma   - continue therapy for community acquired pneumonia currently on rocephin  zithromax     -oxygen requirement will improve within 24 hours and should be weaned for goal Spo2 >88%   -currently req  Bilateral hemidiaphragm elevation    - due to obese body habitus   - improved   Bibasilar atelectasis   -patient should use incentive spirometry when able   - will order metaneb with albuterol  with RT   - PT and OT             Thank you for allowing me to participate in the care of this patient.   Patient/Family are satisfied with care plan and all questions have been answered.    Provider disclosure: Patient with at least one acute or chronic illness or injury that poses a threat to life or bodily function and is being managed actively during this encounter.  All of the below services have been performed independently by signing provider:  review of prior documentation from internal and or external health records.  Review of previous and current lab results.  Interview and comprehensive assessment during patient visit today. Review of current and previous chest radiographs/CT scans. Discussion of management and test interpretation with health care team and patient/family.   This document was prepared using Dragon voice recognition software and may include unintentional dictation errors.     Tyreek Clabo, M.D.  Division of Pulmonary & Critical Care Medicine

## 2024-02-28 ENCOUNTER — Other Ambulatory Visit (HOSPITAL_COMMUNITY): Payer: Self-pay

## 2024-05-03 ENCOUNTER — Other Ambulatory Visit: Payer: Self-pay
